# Patient Record
Sex: Female | Born: 1975 | Race: White | Hispanic: No | Marital: Single | Smoking: Never smoker
Health system: Southern US, Community
[De-identification: ages and names within clinical notes are randomized; demographics above are authoritative.]

## PROBLEM LIST (undated history)

## (undated) DIAGNOSIS — F329 Major depressive disorder, single episode, unspecified: Secondary | ICD-10-CM

## (undated) DIAGNOSIS — I340 Nonrheumatic mitral (valve) insufficiency: Secondary | ICD-10-CM

## (undated) DIAGNOSIS — Z8619 Personal history of other infectious and parasitic diseases: Secondary | ICD-10-CM

## (undated) DIAGNOSIS — B001 Herpesviral vesicular dermatitis: Secondary | ICD-10-CM

## (undated) DIAGNOSIS — I4892 Unspecified atrial flutter: Secondary | ICD-10-CM

## (undated) DIAGNOSIS — S32009A Unspecified fracture of unspecified lumbar vertebra, initial encounter for closed fracture: Secondary | ICD-10-CM

## (undated) DIAGNOSIS — Z9289 Personal history of other medical treatment: Secondary | ICD-10-CM

## (undated) DIAGNOSIS — Z87442 Personal history of urinary calculi: Secondary | ICD-10-CM

## (undated) DIAGNOSIS — Z8041 Family history of malignant neoplasm of ovary: Secondary | ICD-10-CM

## (undated) DIAGNOSIS — F419 Anxiety disorder, unspecified: Secondary | ICD-10-CM

## (undated) DIAGNOSIS — Z87898 Personal history of other specified conditions: Secondary | ICD-10-CM

## (undated) DIAGNOSIS — Z803 Family history of malignant neoplasm of breast: Secondary | ICD-10-CM

## (undated) DIAGNOSIS — K219 Gastro-esophageal reflux disease without esophagitis: Secondary | ICD-10-CM

## (undated) DIAGNOSIS — R011 Cardiac murmur, unspecified: Secondary | ICD-10-CM

## (undated) DIAGNOSIS — F32A Depression, unspecified: Secondary | ICD-10-CM

## (undated) DIAGNOSIS — Z1371 Encounter for nonprocreative screening for genetic disease carrier status: Secondary | ICD-10-CM

## (undated) DIAGNOSIS — R102 Pelvic and perineal pain: Secondary | ICD-10-CM

## (undated) DIAGNOSIS — U071 COVID-19: Secondary | ICD-10-CM

## (undated) DIAGNOSIS — E669 Obesity, unspecified: Secondary | ICD-10-CM

## (undated) DIAGNOSIS — I499 Cardiac arrhythmia, unspecified: Secondary | ICD-10-CM

## (undated) HISTORY — DX: Herpesviral vesicular dermatitis: B00.1

## (undated) HISTORY — DX: Personal history of other medical treatment: Z92.89

## (undated) HISTORY — PX: ELBOW SURGERY: SHX618

## (undated) HISTORY — DX: Pelvic and perineal pain: R10.2

## (undated) HISTORY — DX: Family history of malignant neoplasm of breast: Z80.3

## (undated) HISTORY — PX: ABLATION: SHX5711

## (undated) HISTORY — DX: Personal history of other specified conditions: Z87.898

## (undated) HISTORY — PX: ENDOMETRIAL ABLATION: SHX621

## (undated) HISTORY — PX: INDUCED ABORTION: SHX677

## (undated) HISTORY — DX: Family history of malignant neoplasm of ovary: Z80.41

## (undated) HISTORY — DX: COVID-19: U07.1

---

## 1996-07-30 DIAGNOSIS — Z8751 Personal history of pre-term labor: Secondary | ICD-10-CM

## 1998-06-11 HISTORY — PX: DILATION AND CURETTAGE OF UTERUS: SHX78

## 2003-06-12 HISTORY — PX: DIAGNOSTIC LAPAROSCOPY: SUR761

## 2004-07-07 ENCOUNTER — Ambulatory Visit: Payer: Self-pay | Admitting: Obstetrics and Gynecology

## 2004-08-15 ENCOUNTER — Ambulatory Visit: Payer: Self-pay | Admitting: Obstetrics and Gynecology

## 2004-08-17 ENCOUNTER — Ambulatory Visit: Payer: Self-pay

## 2006-03-05 ENCOUNTER — Emergency Department: Payer: Self-pay | Admitting: Emergency Medicine

## 2006-06-11 HISTORY — PX: BACK SURGERY: SHX140

## 2006-11-13 ENCOUNTER — Ambulatory Visit: Payer: Self-pay

## 2006-12-25 ENCOUNTER — Ambulatory Visit: Payer: Self-pay | Admitting: Family Medicine

## 2007-01-04 ENCOUNTER — Emergency Department: Payer: Self-pay

## 2007-04-11 ENCOUNTER — Inpatient Hospital Stay (HOSPITAL_COMMUNITY): Admission: RE | Admit: 2007-04-11 | Discharge: 2007-04-14 | Payer: Self-pay | Admitting: Neurosurgery

## 2008-04-30 ENCOUNTER — Ambulatory Visit: Payer: Self-pay | Admitting: Family Medicine

## 2009-05-03 ENCOUNTER — Ambulatory Visit: Payer: Self-pay | Admitting: Family Medicine

## 2009-12-01 ENCOUNTER — Emergency Department: Payer: Self-pay | Admitting: Emergency Medicine

## 2010-01-27 ENCOUNTER — Ambulatory Visit: Payer: Self-pay | Admitting: Family Medicine

## 2010-08-16 ENCOUNTER — Ambulatory Visit: Payer: Self-pay | Admitting: Family Medicine

## 2010-10-24 NOTE — Op Note (Signed)
Cynthia Solis, Cynthia Solis                ACCOUNT NO.:  [id number]   MEDICAL RECORD NO.:  [id number]          PATIENT TYPE:  INP   LOCATION:  3172                         FACILITY:  [facility]   PHYSICIAN:  Payton Doughty, M.D.      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   OPERATIVE PROCEDURE:  L5-S1 Gill procedure, posterior lumbar antibody  fusion, nonsegmental pedicle screw fixation, and posterolateral  arthrodesis.   ANESTHESIA:  General endotracheal anesthesia.   PREPARATION:  Betadine prep and alcohol wipe.   COMPLICATIONS:  None.   SURGEON:  Payton Doughty, M.D.   ASSISTANTBasilia Jumbo.   BODY OF TEXT:  35 year old girl with bilateral pars defects of L5, taken  to the operating room, smoothly anesthetized and intubated, placed prone  on the operating table. Following shave, prep, drape in the usual  sterile fashion, the skin was infiltrated with 1% some lidocaine with  1:100,000 epinephrine.  Then, the skin was incised from mid L4 to mid S1  and the lamina and transverse process of L4 and the sacral ala were  exposed bilaterally in a subperiosteal plane.  Intraoperative x-ray  confirmed correctness of the level.  Having confirmed correctness of the  level, the lamina of L5 was drilled down using a high speed drill and as  a single piece with the inferior facet of L5 and the pars remnant was  removed en toto.  This allowed exposure of the L5 root as it rounded the  L5 pedicle.  It was completely decompressed.  Most of the S1 superior  facet was left.  This was done bilaterally and the compression was  comparable on each side.  A PEEK cage 11 mm was then selected and placed  in the midline at L5-S1.  This was done with minimal distraction of  either L5 root, which were fairly tense.  This was done with the thecal  sac retracted from lateral to medial.  Once the cage had been packed  with bone graft in place, pedicle screws were then placed in L5 and S1  using the standard landmarks.  Intraoperative x-ray showed good  placement of the screws.  They were connected by the rod and the caps  tightened with the final tightener.  The transverse process of L5 and  the sacral ala were decorticated with a high speed drill and packed with  BMP on the extender matrix.  Intraoperative x-ray showed good placement of cage, screws, and rods.  Successive layers of 0 Vicryl, 2-0 Vicryl, and 3-0 nylon were used to  close.  Betadine and Telfa dressing was applied.  The patient returned  to the recovery room in good condition.           ______________________________  Payton Doughty, M.D.  MWR/MEDQ  D:  04/11/2007  T:  04/11/2007  Job:  [id number]

## 2010-10-24 NOTE — H&P (Signed)
NAMESHIR, BERGMAN                ACCOUNT NO.:  [id number]   MEDICAL RECORD NO.:  [id number]          PATIENT TYPE:  INP   LOCATION:  3001                         FACILITY:  [facility]   PHYSICIAN:  Payton Doughty, M.D.      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     The patient is a 35 year old right-handed white female who since 2001  had increasing pain in her back, down her legs.  It was found in 2000  she has had spondylolysis and slight progression of her  spondylolisthesis.  She has managed this conservatively for some time  and has increasing pain in her back and down her legs, and she is now  admitted for decompression and fusion.   MEDICAL HISTORY:  Otherwise benign.   She is on oral contraceptives and Wellbutrin.   She is allergic to AVELOX.   SURGICAL HISTORY:  A D&C in 2002 and a laparoscopy in 2006.   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   FAMILY HISTORY:  Mom has had breast cancer twice, in good health now.  Father is in good health except for his back.   REVIEW OF SYSTEMS:  Remarkable for leg weakness and leg pain.   HEENT:  Within normal limits.  She has reasonable range of motion of her neck.  CHEST:  Clear.  CARDIAC:  Regular rate and rhythm.  ABDOMEN:  Nontender with no hepatosplenomegaly.  Extremities: No clubbing or cyanosis.  GU:  exam is deferred.  Peripheral pulses are good.  NEUROLOGIC:  She is awake, alert and oriented.  Cranial nerves appear to  function normally.  Motor exam shows 5/5 strength throughout the upper  and lower extremities.  No current sensory deficit.  Reflexes are 2 at  the knees, 2 at the ankles, and toes downgoing bilaterally.  When her  back flares up she seeks comfort by bending forward.   Films show  spondylolysis at L5 with a grade 1 slip of L5 on S1.  This is  confirmed on MR.  With flexion and extension she moves about 2 mm.   CLINICAL IMPRESSION:  Lumbar radicular pain and increasing back pain.   PLAN:  For a Gill procedure with fusion at L5-S1.  The risks and  benefits have been discussed with her and she wished to proceed.           ______________________________  Payton Doughty, M.D.     MWR/MEDQ  D:  04/11/2007  T:  04/11/2007  Job:  [id number]

## 2010-10-27 NOTE — Discharge Summary (Signed)
Cynthia Solis, Cynthia Solis                ACCOUNT NO.:  [id number]   MEDICAL RECORD NO.:  [id number]          PATIENT TYPE:  INP   LOCATION:  3001                         FACILITY:  [facility]   PHYSICIAN:  Clydene Fake, M.D.  DATE OF BIRTH:  June 28, 1975   DATE OF ADMISSION:  04/11/2007  DATE OF DISCHARGE:  04/14/2007                               DISCHARGE SUMMARY   ADMITTING DIAGNOSES:  1. Spondylosis L5.  2. Spondylolisthesis L5-S1.   DISCHARGE DIAGNOSES:  1. Spondylosis L5.  2. Spondylolisthesis L5-S1.   PROCEDURE:  L5-S1 posterior lumbar interbody Gill decompression,  posterior lumbar interbody fusion with instrumentation by Dr. Channing Mutters.   REASON FOR ADMISSION:  The patient is 35 year old woman with increasing  back pain and leg managed conservatively.  She continued to have  increasing pain and was admitted for decompression and fusion.   HOSPITAL COURSE:  The patient was admitted the day of surgery and  underwent procedure above without complications.  Postop, the patient  was transferred to recovery room and then to the floor.  She was started  on Neurontin postoperatively for some neuropathic pain.  On the  following day, she had incisional pain and some left buttock pain, but  was overall doing well.  The incision was clean, dry and intact.  She  started increasing activity.  PT/OT was consulted and aware of  increasing activity.  She continued making good progress in her  activity.  On 04/14/2007, she was discharged home in stable condition.  She was ambulating much better, but still having some hip and leg pain.  She was given Neurontin 300 t.i.d. and discharged with Percocet and  Flexeril on p.r.n.  The rest of her home medicines are the same.   FOLLOWUP:  Dr. Channing Mutters in three weeks.  No strenuous activity and up with  brace.           ______________________________  Clydene Fake, M.D.     JRH/MEDQ  D:  06/17/2007  T:  06/17/2007  Job:  [id number]

## 2011-03-06 ENCOUNTER — Ambulatory Visit: Payer: Self-pay

## 2011-03-21 LAB — TYPE AND SCREEN: ABO/RH(D): O POS

## 2011-03-21 LAB — URINALYSIS, ROUTINE W REFLEX MICROSCOPIC
Nitrite: NEGATIVE
Urobilinogen, UA: 0.2
pH: 6.5

## 2011-03-21 LAB — DIFFERENTIAL
Basophils Absolute: 0.1
Basophils Relative: 1
Eosinophils Absolute: 0.1
Eosinophils Relative: 2
Lymphocytes Relative: 33
Monocytes Absolute: 0.5

## 2011-03-21 LAB — URINE MICROSCOPIC-ADD ON

## 2011-03-21 LAB — COMPREHENSIVE METABOLIC PANEL
ALT: 15
AST: 17
Albumin: 3.7
BUN: 12
GFR calc Af Amer: >60
Sodium: 137
Total Protein: 6.6

## 2011-03-21 LAB — PROTIME-INR
INR: 0.9
Prothrombin Time: 12.3

## 2011-03-21 LAB — CBC
HCT: 42.4
Hemoglobin: 14.3
MCV: 89.8
RBC: 4.72

## 2011-03-21 LAB — APTT: aPTT: 26

## 2012-09-19 ENCOUNTER — Ambulatory Visit: Payer: Self-pay

## 2013-10-13 ENCOUNTER — Emergency Department (HOSPITAL_COMMUNITY)
Admission: EM | Admit: 2013-10-13 | Discharge: 2013-10-13 | Disposition: A | Discharge status: Home / Self Care | Payer: Self-pay | Attending: Emergency Medicine | Admitting: Emergency Medicine

## 2013-10-13 ENCOUNTER — Emergency Department (HOSPITAL_COMMUNITY): Payer: Self-pay

## 2013-10-13 ENCOUNTER — Encounter (HOSPITAL_COMMUNITY): Payer: Self-pay | Admitting: Emergency Medicine

## 2013-10-13 DIAGNOSIS — R0602 Shortness of breath: Secondary | ICD-10-CM | POA: Insufficient documentation

## 2013-10-13 DIAGNOSIS — R079 Chest pain, unspecified: Secondary | ICD-10-CM | POA: Insufficient documentation

## 2013-10-13 LAB — BASIC METABOLIC PANEL
BUN: 15 mg/dL (ref 6–23)
CO2: 26 mEq/L (ref 19–32)
Calcium: 9.2 mg/dL (ref 8.4–10.5)
Chloride: 105 mEq/L (ref 96–112)
Creatinine, Ser: 0.96 mg/dL (ref 0.50–1.10)
GFR calc Af Amer: 87 mL/min — ABNORMAL LOW (ref >90)
GFR calc non Af Amer: 75 mL/min — ABNORMAL LOW (ref >90)
Glucose, Bld: 102 mg/dL — ABNORMAL HIGH (ref 70–99)
POTASSIUM: 4.1 meq/L (ref 3.7–5.3)
SODIUM: 142 meq/L (ref 137–147)

## 2013-10-13 LAB — CBC
HEMATOCRIT: 41.8 % (ref 36.0–46.0)
Hemoglobin: 14 g/dL (ref 12.0–15.0)
MCH: 30.6 pg (ref 26.0–34.0)
MCHC: 33.5 g/dL (ref 30.0–36.0)
MCV: 91.3 fL (ref 78.0–100.0)
PLATELETS: 275 10*3/uL (ref 150–400)
RBC: 4.58 MIL/uL (ref 3.87–5.11)
RDW: 13.4 % (ref 11.5–15.5)
WBC: 6.6 10*3/uL (ref 4.0–10.5)

## 2013-10-13 LAB — I-STAT TROPONIN, ED: Troponin i, poc: 0.01 ng/mL (ref 0.00–0.08)

## 2013-10-13 NOTE — ED Notes (Signed)
Pt reports central chest pain since this am, worse with deep breath. Reports shortness of breath with exertion.

## 2013-10-13 NOTE — ED Notes (Signed)
Called for triage x1 with no answer. 

## 2014-04-20 DIAGNOSIS — Z9289 Personal history of other medical treatment: Secondary | ICD-10-CM

## 2014-04-20 HISTORY — DX: Personal history of other medical treatment: Z92.89

## 2014-06-27 DIAGNOSIS — Z87898 Personal history of other specified conditions: Secondary | ICD-10-CM

## 2014-06-27 HISTORY — DX: Personal history of other specified conditions: Z87.898

## 2014-07-18 ENCOUNTER — Emergency Department: Payer: Self-pay | Admitting: Emergency Medicine

## 2014-07-18 LAB — COMPREHENSIVE METABOLIC PANEL
ALBUMIN: 4.2 g/dL (ref 3.4–5.0)
ALK PHOS: 75 U/L (ref 46–116)
ALT: 39 U/L (ref 14–63)
Anion Gap: 7 (ref 7–16)
BILIRUBIN TOTAL: 0.6 mg/dL (ref 0.2–1.0)
BUN: 10 mg/dL (ref 7–18)
CO2: 26 mmol/L (ref 21–32)
CREATININE: 0.86 mg/dL (ref 0.60–1.30)
Calcium, Total: 8.9 mg/dL (ref 8.5–10.1)
Chloride: 105 mmol/L (ref 98–107)
EGFR (African American): >60
GLUCOSE: 80 mg/dL (ref 65–99)
OSMOLALITY: 274 (ref 275–301)
Potassium: 3.6 mmol/L (ref 3.5–5.1)
SGOT(AST): 28 U/L (ref 15–37)
SODIUM: 138 mmol/L (ref 136–145)
Total Protein: 7.8 g/dL (ref 6.4–8.2)

## 2014-07-18 LAB — TROPONIN I
Troponin-I: <0.02 ng/mL
Troponin-I: <0.02 ng/mL

## 2014-07-18 LAB — CBC
HCT: 45 % (ref 35.0–47.0)
HGB: 15.2 g/dL (ref 12.0–16.0)
MCH: 30.1 pg (ref 26.0–34.0)
MCHC: 33.7 g/dL (ref 32.0–36.0)
MCV: 89 fL (ref 80–100)
PLATELETS: 330 10*3/uL (ref 150–440)
RBC: 5.04 10*6/uL (ref 3.80–5.20)
RDW: 13.7 % (ref 11.5–14.5)
WBC: 9.7 10*3/uL (ref 3.6–11.0)

## 2014-07-18 LAB — LIPASE, BLOOD: Lipase: 104 U/L (ref 73–393)

## 2014-07-18 LAB — D-DIMER(ARMC): D-DIMER: 419 ng/mL

## 2014-08-10 DIAGNOSIS — Z803 Family history of malignant neoplasm of breast: Secondary | ICD-10-CM

## 2014-08-10 HISTORY — DX: Family history of malignant neoplasm of breast: Z80.3

## 2015-01-04 ENCOUNTER — Ambulatory Visit
Admission: RE | Admit: 2015-01-04 | Discharge: 2015-01-04 | Disposition: A | Discharge status: Home / Self Care | Payer: PRIVATE HEALTH INSURANCE | Source: Ambulatory Visit | Attending: Family Medicine | Admitting: Family Medicine

## 2015-01-04 ENCOUNTER — Other Ambulatory Visit: Payer: Self-pay | Admitting: Family Medicine

## 2015-01-04 DIAGNOSIS — M79661 Pain in right lower leg: Secondary | ICD-10-CM | POA: Insufficient documentation

## 2015-01-04 DIAGNOSIS — M7989 Other specified soft tissue disorders: Principal | ICD-10-CM

## 2015-01-04 DIAGNOSIS — M79604 Pain in right leg: Secondary | ICD-10-CM

## 2015-02-15 ENCOUNTER — Other Ambulatory Visit: Payer: Self-pay | Admitting: Physical Medicine and Rehabilitation

## 2015-02-15 DIAGNOSIS — M5416 Radiculopathy, lumbar region: Secondary | ICD-10-CM

## 2015-02-22 ENCOUNTER — Ambulatory Visit
Admission: RE | Admit: 2015-02-22 | Discharge: 2015-02-22 | Disposition: A | Discharge status: Home / Self Care | Payer: PRIVATE HEALTH INSURANCE | Source: Ambulatory Visit | Attending: Physical Medicine and Rehabilitation | Admitting: Physical Medicine and Rehabilitation

## 2015-02-22 DIAGNOSIS — M79604 Pain in right leg: Secondary | ICD-10-CM | POA: Diagnosis present

## 2015-02-22 DIAGNOSIS — M5416 Radiculopathy, lumbar region: Secondary | ICD-10-CM

## 2015-02-22 DIAGNOSIS — M4326 Fusion of spine, lumbar region: Secondary | ICD-10-CM | POA: Diagnosis not present

## 2015-02-22 DIAGNOSIS — M545 Low back pain: Secondary | ICD-10-CM | POA: Diagnosis present

## 2015-03-08 ENCOUNTER — Other Ambulatory Visit: Payer: Self-pay | Admitting: Obstetrics and Gynecology

## 2015-03-08 DIAGNOSIS — R110 Nausea: Secondary | ICD-10-CM

## 2015-03-10 ENCOUNTER — Ambulatory Visit
Admission: RE | Admit: 2015-03-10 | Discharge: 2015-03-10 | Disposition: A | Discharge status: Home / Self Care | Payer: PRIVATE HEALTH INSURANCE | Source: Ambulatory Visit | Attending: Obstetrics and Gynecology | Admitting: Obstetrics and Gynecology

## 2015-03-10 DIAGNOSIS — R110 Nausea: Secondary | ICD-10-CM

## 2015-03-10 DIAGNOSIS — K760 Fatty (change of) liver, not elsewhere classified: Secondary | ICD-10-CM | POA: Insufficient documentation

## 2015-03-28 ENCOUNTER — Other Ambulatory Visit: Payer: Self-pay | Admitting: Physician Assistant

## 2015-03-28 ENCOUNTER — Encounter
Admission: RE | Admit: 2015-03-28 | Discharge: 2015-03-28 | Disposition: A | Discharge status: Home / Self Care | Payer: PRIVATE HEALTH INSURANCE | Source: Ambulatory Visit | Attending: Obstetrics & Gynecology | Admitting: Obstetrics & Gynecology

## 2015-03-28 DIAGNOSIS — Z8041 Family history of malignant neoplasm of ovary: Secondary | ICD-10-CM

## 2015-03-28 DIAGNOSIS — R1909 Other intra-abdominal and pelvic swelling, mass and lump: Secondary | ICD-10-CM | POA: Insufficient documentation

## 2015-03-28 DIAGNOSIS — Z01818 Encounter for other preprocedural examination: Secondary | ICD-10-CM | POA: Diagnosis not present

## 2015-03-28 DIAGNOSIS — R438 Other disturbances of smell and taste: Secondary | ICD-10-CM

## 2015-03-28 HISTORY — DX: Cardiac murmur, unspecified: R01.1

## 2015-03-28 HISTORY — DX: Depression, unspecified: F32.A

## 2015-03-28 HISTORY — DX: Anxiety disorder, unspecified: F41.9

## 2015-03-28 HISTORY — DX: Major depressive disorder, single episode, unspecified: F32.9

## 2015-03-28 HISTORY — DX: Family history of malignant neoplasm of ovary: Z80.41

## 2015-03-28 LAB — CBC
HCT: 41.1 % (ref 35.0–47.0)
Hemoglobin: 13.9 g/dL (ref 12.0–16.0)
MCH: 30.6 pg (ref 26.0–34.0)
MCHC: 33.8 g/dL (ref 32.0–36.0)
MCV: 90.5 fL (ref 80.0–100.0)
PLATELETS: 343 10*3/uL (ref 150–440)
RBC: 4.54 MIL/uL (ref 3.80–5.20)
RDW: 14 % (ref 11.5–14.5)
WBC: 7.9 10*3/uL (ref 3.6–11.0)

## 2015-03-28 LAB — ABO/RH: ABO/RH(D): O POS

## 2015-03-28 LAB — TYPE AND SCREEN
ABO/RH(D): O POS
Antibody Screen: NEGATIVE

## 2015-03-28 NOTE — Patient Instructions (Signed)
  Your procedure is scheduled on: Tuesday Oct. 25, 2016 Report to Same Day Surgery. To find out your arrival time please call [phone] between 1PM - 3PM on Monday Oct.24, 2016  Remember: Instructions that are not followed completely may result in serious medical risk, up to and including death, or upon the discretion of your surgeon and anesthesiologist your surgery may need to be rescheduled.    __x_ 1. Do not eat food or drink liquids after midnight. No gum chewing or hard candies.     ____ 2. No Alcohol for 24 hours before or after surgery.   ____ 3. Bring all medications with you on the day of surgery if instructed.    _x__ 4. Notify your doctor if there is any change in your medical condition     (cold, fever, infections).     Do not wear jewelry, make-up, hairpins, clips or nail polish.  Do not wear lotions, powders, or perfumes. You may wear deodorant.  Do not shave 48 hours prior to surgery. Men may shave face and neck.  Do not bring valuables to the hospital.    [facility] is not responsible for any belongings or valuables.               Contacts, dentures or bridgework may not be worn into surgery.  Leave your suitcase in the car. After surgery it may be brought to your room.  For patients admitted to the hospital, discharge time is determined by your treatment team.   Patients discharged the day of surgery will not be allowed to drive home.    Please read over the following fact sheets that you were given:   [facility] Preparing for [facility]  __x_ Take these medicines the morning of surgery with A SIP OF WATER:    1. metoprolol tartrate (LOPRESSOR)  2. gabapentin (NEURONTIN)  3. buPROPion (WELLBUTRIN XL)   4. PRAZolam Prudy Feeler(XANAX) optional    ____ Fleet Enema (as directed)   __x_ Use CHG Soap as directed  ____ Use inhalers on the day of surgery  ____ Stop metformin 2 days prior to surgery    ____ Take 1/2 of usual insulin dose the night before surgery and  none on the morning of urgery.   __x_ Stop Coumadin/Plavix/aspirin on does not apply  _x__ Stop Anti-inflammatories now, Tylenol ok to take for pain.  ____ Stop supplements until after surgery.    ____ Bring C-Pap to the hospital.

## 2015-03-29 LAB — CA 125: CA 125: 8.4 U/mL (ref 0.0–38.1)

## 2015-04-05 ENCOUNTER — Ambulatory Visit: Payer: PRIVATE HEALTH INSURANCE | Admitting: Anesthesiology

## 2015-04-05 ENCOUNTER — Encounter: Payer: Self-pay | Admitting: *Deleted

## 2015-04-05 ENCOUNTER — Encounter
Admission: RE | Disposition: A | Discharge status: Home / Self Care | Payer: Self-pay | Source: Ambulatory Visit | Attending: Obstetrics & Gynecology

## 2015-04-05 ENCOUNTER — Ambulatory Visit
Admission: RE | Admit: 2015-04-05 | Discharge: 2015-04-05 | Disposition: A | Discharge status: Home / Self Care | Payer: PRIVATE HEALTH INSURANCE | Source: Ambulatory Visit | Attending: Obstetrics & Gynecology | Admitting: Obstetrics & Gynecology

## 2015-04-05 DIAGNOSIS — N8302 Follicular cyst of left ovary: Secondary | ICD-10-CM | POA: Diagnosis not present

## 2015-04-05 DIAGNOSIS — Z79899 Other long term (current) drug therapy: Secondary | ICD-10-CM | POA: Insufficient documentation

## 2015-04-05 DIAGNOSIS — N83209 Unspecified ovarian cyst, unspecified side: Secondary | ICD-10-CM | POA: Diagnosis present

## 2015-04-05 DIAGNOSIS — N838 Other noninflammatory disorders of ovary, fallopian tube and broad ligament: Secondary | ICD-10-CM | POA: Diagnosis not present

## 2015-04-05 DIAGNOSIS — Z888 Allergy status to other drugs, medicaments and biological substances status: Secondary | ICD-10-CM | POA: Insufficient documentation

## 2015-04-05 DIAGNOSIS — R011 Cardiac murmur, unspecified: Secondary | ICD-10-CM | POA: Diagnosis not present

## 2015-04-05 DIAGNOSIS — Z803 Family history of malignant neoplasm of breast: Secondary | ICD-10-CM | POA: Diagnosis not present

## 2015-04-05 DIAGNOSIS — Z8042 Family history of malignant neoplasm of prostate: Secondary | ICD-10-CM | POA: Insufficient documentation

## 2015-04-05 DIAGNOSIS — Z820 Family history of epilepsy and other diseases of the nervous system: Secondary | ICD-10-CM | POA: Insufficient documentation

## 2015-04-05 DIAGNOSIS — Z8249 Family history of ischemic heart disease and other diseases of the circulatory system: Secondary | ICD-10-CM | POA: Insufficient documentation

## 2015-04-05 DIAGNOSIS — Z8041 Family history of malignant neoplasm of ovary: Secondary | ICD-10-CM | POA: Insufficient documentation

## 2015-04-05 DIAGNOSIS — Z833 Family history of diabetes mellitus: Secondary | ICD-10-CM | POA: Insufficient documentation

## 2015-04-05 DIAGNOSIS — Z823 Family history of stroke: Secondary | ICD-10-CM | POA: Diagnosis not present

## 2015-04-05 DIAGNOSIS — F418 Other specified anxiety disorders: Secondary | ICD-10-CM | POA: Diagnosis not present

## 2015-04-05 DIAGNOSIS — R102 Pelvic and perineal pain: Secondary | ICD-10-CM | POA: Insufficient documentation

## 2015-04-05 DIAGNOSIS — N8312 Corpus luteum cyst of left ovary: Secondary | ICD-10-CM | POA: Diagnosis not present

## 2015-04-05 DIAGNOSIS — Z882 Allergy status to sulfonamides status: Secondary | ICD-10-CM | POA: Diagnosis not present

## 2015-04-05 HISTORY — PX: LAPAROSCOPIC SALPINGO OOPHERECTOMY: SHX5927

## 2015-04-05 HISTORY — PX: LAPAROSCOPY: SHX197

## 2015-04-05 SURGERY — LAPAROSCOPY OPERATIVE
Anesthesia: General

## 2015-04-05 MED ORDER — DEXAMETHASONE SODIUM PHOSPHATE 4 MG/ML IJ SOLN
INTRAMUSCULAR | Status: DC | PRN
  Administered 2015-04-05: 8 mg via INTRAVENOUS

## 2015-04-05 MED ORDER — FENTANYL CITRATE (PF) 100 MCG/2ML IJ SOLN
25.0000 ug | INTRAMUSCULAR | Status: DC | PRN
  Administered 2015-04-05 (×4): 25 ug via INTRAVENOUS

## 2015-04-05 MED ORDER — FAMOTIDINE 20 MG PO TABS
20.0000 mg | ORAL_TABLET | Freq: Once | ORAL | Status: AC
  Administered 2015-04-05: 20 mg via ORAL

## 2015-04-05 MED ORDER — ACETAMINOPHEN 10 MG/ML IV SOLN
INTRAVENOUS | Status: AC
  Filled 2015-04-05: qty 100

## 2015-04-05 MED ORDER — NEOSTIGMINE METHYLSULFATE 10 MG/10ML IV SOLN
INTRAVENOUS | Status: DC | PRN
  Administered 2015-04-05: 3 mg via INTRAVENOUS

## 2015-04-05 MED ORDER — ONDANSETRON HCL 4 MG/2ML IJ SOLN
4.0000 mg | Freq: Once | INTRAMUSCULAR | Status: AC | PRN
  Administered 2015-04-05: 4 mg via INTRAVENOUS

## 2015-04-05 MED ORDER — EPHEDRINE SULFATE 50 MG/ML IJ SOLN
INTRAMUSCULAR | Status: DC | PRN
  Administered 2015-04-05: 10 mg via INTRAVENOUS
  Administered 2015-04-05 (×2): 5 mg via INTRAVENOUS

## 2015-04-05 MED ORDER — LACTATED RINGERS IV SOLN
INTRAVENOUS | Status: DC
  Administered 2015-04-05 (×2): via INTRAVENOUS

## 2015-04-05 MED ORDER — MIDAZOLAM HCL 2 MG/2ML IJ SOLN
INTRAMUSCULAR | Status: DC | PRN
  Administered 2015-04-05: 2 mg via INTRAVENOUS

## 2015-04-05 MED ORDER — FENTANYL CITRATE (PF) 100 MCG/2ML IJ SOLN
INTRAMUSCULAR | Status: AC
  Administered 2015-04-05: 25 ug via INTRAVENOUS
  Filled 2015-04-05: qty 2

## 2015-04-05 MED ORDER — FENTANYL CITRATE (PF) 100 MCG/2ML IJ SOLN
INTRAMUSCULAR | Status: DC | PRN
  Administered 2015-04-05: 100 ug via INTRAVENOUS

## 2015-04-05 MED ORDER — ONDANSETRON HCL 4 MG/2ML IJ SOLN
INTRAMUSCULAR | Status: DC | PRN
  Administered 2015-04-05: 4 mg via INTRAVENOUS

## 2015-04-05 MED ORDER — PROPOFOL 10 MG/ML IV BOLUS
INTRAVENOUS | Status: DC | PRN
  Administered 2015-04-05: 200 mg via INTRAVENOUS

## 2015-04-05 MED ORDER — GLYCOPYRROLATE 0.2 MG/ML IJ SOLN
INTRAMUSCULAR | Status: DC | PRN
  Administered 2015-04-05: 0.6 mg via INTRAVENOUS

## 2015-04-05 MED ORDER — OXYCODONE-ACETAMINOPHEN 5-325 MG PO TABS
1.0000 | ORAL_TABLET | ORAL | Status: DC | PRN
  Administered 2015-04-05: 1 via ORAL

## 2015-04-05 MED ORDER — SUCCINYLCHOLINE CHLORIDE 20 MG/ML IJ SOLN
INTRAMUSCULAR | Status: DC | PRN
  Administered 2015-04-05: 100 mg via INTRAVENOUS

## 2015-04-05 MED ORDER — OXYCODONE-ACETAMINOPHEN 5-325 MG PO TABS: 1.0000 | ORAL_TABLET | ORAL | Status: DC | PRN

## 2015-04-05 MED ORDER — LIDOCAINE HCL (CARDIAC) 20 MG/ML IV SOLN
INTRAVENOUS | Status: DC | PRN
  Administered 2015-04-05: 100 mg via INTRAVENOUS

## 2015-04-05 MED ORDER — BUPIVACAINE HCL (PF) 0.5 % IJ SOLN
INTRAMUSCULAR | Status: DC | PRN
  Administered 2015-04-05: 14 mL

## 2015-04-05 MED ORDER — ACETAMINOPHEN 10 MG/ML IV SOLN
INTRAVENOUS | Status: DC | PRN
  Administered 2015-04-05: 1000 mg via INTRAVENOUS

## 2015-04-05 MED ORDER — OXYCODONE-ACETAMINOPHEN 5-325 MG PO TABS
ORAL_TABLET | ORAL | Status: AC
  Filled 2015-04-05: qty 1

## 2015-04-05 MED ORDER — FAMOTIDINE 20 MG PO TABS
ORAL_TABLET | ORAL | Status: AC
  Filled 2015-04-05: qty 1

## 2015-04-05 MED ORDER — KETOROLAC TROMETHAMINE 30 MG/ML IJ SOLN
INTRAMUSCULAR | Status: DC | PRN
  Administered 2015-04-05: 30 mg via INTRAVENOUS

## 2015-04-05 MED ORDER — BUPIVACAINE HCL (PF) 0.5 % IJ SOLN
INTRAMUSCULAR | Status: AC
  Filled 2015-04-05: qty 30

## 2015-04-05 MED ORDER — ONDANSETRON HCL 4 MG/2ML IJ SOLN
INTRAMUSCULAR | Status: AC
  Administered 2015-04-05: 4 mg via INTRAVENOUS
  Filled 2015-04-05: qty 2

## 2015-04-05 MED ORDER — FENTANYL CITRATE (PF) 100 MCG/2ML IJ SOLN
INTRAMUSCULAR | Status: AC
  Filled 2015-04-05: qty 2

## 2015-04-05 MED ORDER — ROCURONIUM BROMIDE 100 MG/10ML IV SOLN
INTRAVENOUS | Status: DC | PRN
  Administered 2015-04-05: 5 mg via INTRAVENOUS
  Administered 2015-04-05: 10 mg via INTRAVENOUS

## 2015-04-05 SURGICAL SUPPLY — 33 items
BLADE SURG SZ11 CARB STEEL (BLADE) ×3 IMPLANT
CANISTER SUCT 1200ML W/VALVE (MISCELLANEOUS) ×3 IMPLANT
CATH ROBINSON RED A/P 16FR (CATHETERS) ×3 IMPLANT
CHLORAPREP W/TINT 26ML (MISCELLANEOUS) ×3 IMPLANT
DRESSING TELFA 4X3 1S ST N-ADH (GAUZE/BANDAGES/DRESSINGS) IMPLANT
ENDOPOUCH RETRIEVER 10 (MISCELLANEOUS) IMPLANT
GAUZE SPONGE NON-WVN 2X2 STRL (MISCELLANEOUS) IMPLANT
GLOVE BIO SURGEON STRL SZ8 (GLOVE) ×12 IMPLANT
GLOVE INDICATOR 7.5 STRL GRN (GLOVE) ×12 IMPLANT
GOWN STRL REUS W/ TWL LRG LVL3 (GOWN DISPOSABLE) ×2 IMPLANT
GOWN STRL REUS W/ TWL XL LVL3 (GOWN DISPOSABLE) ×2 IMPLANT
GOWN STRL REUS W/TWL LRG LVL3 (GOWN DISPOSABLE) ×1
GOWN STRL REUS W/TWL XL LVL3 (GOWN DISPOSABLE) ×1
IRRIGATION STRYKERFLOW (MISCELLANEOUS) IMPLANT
IRRIGATOR STRYKERFLOW (MISCELLANEOUS)
IV LACTATED RINGERS 1000ML (IV SOLUTION) IMPLANT
LABEL OR SOLS (LABEL) ×3 IMPLANT
LIQUID BAND (GAUZE/BANDAGES/DRESSINGS) ×3 IMPLANT
NEEDLE VERESS 14GA 120MM (NEEDLE) ×3 IMPLANT
NS IRRIG 500ML POUR BTL (IV SOLUTION) ×3 IMPLANT
PACK GYN LAPAROSCOPIC (MISCELLANEOUS) ×3 IMPLANT
PAD PREP 24X41 OB/GYN DISP (PERSONAL CARE ITEMS) ×3 IMPLANT
SCISSORS METZENBAUM CVD 33 (INSTRUMENTS) IMPLANT
SHEARS HARMONIC ACE PLUS 36CM (ENDOMECHANICALS) IMPLANT
SLEEVE ENDOPATH XCEL 5M (ENDOMECHANICALS) ×3 IMPLANT
SPONGE VERSALON 2X2 STRL (MISCELLANEOUS)
STRAP SAFETY BODY (MISCELLANEOUS) ×3 IMPLANT
SUT VIC AB 2-0 UR6 27 (SUTURE) ×3 IMPLANT
SUT VIC AB 4-0 PS2 18 (SUTURE) IMPLANT
SYRINGE 10CC LL (SYRINGE) ×3 IMPLANT
TROCAR ENDO BLADELESS 11MM (ENDOMECHANICALS) IMPLANT
TROCAR XCEL NON-BLD 5MMX100MML (ENDOMECHANICALS) ×3 IMPLANT
TUBING INSUFFLATOR HI FLOW (MISCELLANEOUS) ×3 IMPLANT

## 2015-04-05 NOTE — Transfer of Care (Signed)
Immediate Anesthesia Transfer of Care Note  Patient: Cynthia Solis  Procedure(s) Performed: Procedure(s): LAPAROSCOPY OPERATIVE (N/A) LAPAROSCOPIC SALPINGO OOPHORECTOMY (Left)  Patient Location: PACU  Anesthesia Type:General  Level of Consciousness: awake and sedated  Airway & Oxygen Therapy: Patient Spontanous Breathing and Patient connected to nasal cannula oxygen  Post-op Assessment: Report given to RN and Post -op Vital signs reviewed and stable  Post vital signs: Reviewed and stable  Last Vitals:  Filed Vitals:   04/05/15 1151  BP: 92/53  Pulse: 71  Temp: 35.9 C  Resp: 18    Complications: No apparent anesthesia complications

## 2015-04-05 NOTE — Op Note (Signed)
  Operative Note   04/05/2015  PRE-OP DIAGNOSIS: Left Ovarian Cyst, Pelvic Pain, Family History of Ovarian Cancer   POST-OP DIAGNOSIS: same   PROCEDURE: Procedure(s): LAPAROSCOPY OPERATIVE LAPAROSCOPIC SALPINGO OOPHORECTOMY   SURGEON: Annamarie MajorPaul Anaeli Cornwall, MD, FACOG  ANESTHESIA: Gen   ESTIMATED BLOOD LOSS: Min  COMPLICATIONS: None  DISPOSITION: PACU - hemodynamically stable.  CONDITION: stable  FINDINGS: Laparoscopic survey of the abdomen revealed a grossly normal uterus, tubes, R ovary, liver edge, gallbladder edge and appendix, No intra-abdominal adhesions were noted. Left Ovarian Cyst noted, multiloculated and tubal cyst as well.  PROCEDURE IN DETAIL: The patient was taken to the OR where anesthesia was administed. The patient was positioned in dorsal lithotomy in the ChalfantAllen stirrups. The patient was then examined under anesthesia with the above noted findings. The patient was prepped and draped in the normal sterile fashion and foley catheter was placed. A Graves speculum was placed in the vagina and the anterior lip of the cervix was grasped with a single toothed tenaculum.  Uterine mobility was found to be satisfactory. The speculum was then removed.  Attention was turned to the patient's abdomen where a 5 mm skin incision was made in the umbilical fold, after injection of local anesthesia. The Veress step needle was carefully introduced into the peritoneal cavity with placement confirmed using the hanging drop technique.  Pneumoperitoneum was obtained. The 5 mm port was then placed under direct visualization with the operative laparoscope.  Trendelenburg positioning.  Additional 11mm trocar was then placed in the RLQ lateral to the inferior epigastric blood vessels under direct visualization with the laparoscope.  Instrumentation to visualize complete pelvic anatomy performed.  A 5mm trocar was also then placed in the suprapubic region.  The ovarian cyst is identified and stabilized.  The  infundibulopelvic ligament and its blood vessels were carefully coagulated and dissected, after visualization of the ureter was done, to completely excise the left tube and ovary. Hemostasis is visualized and assured.  Contralateral ovary seen as normal.  Pelvic cavity is cleaned with any fluid aspirated.  Instruments and trocars removed, gas expelled, and skin closed with skin adhesive glue.  Instrument, needle, and sponge counts correct x2 at the conclusion of the case.  Pt goes to recovery room in stable condition.  Frozen Section analysis revealed benign ovary.

## 2015-04-05 NOTE — Anesthesia Preprocedure Evaluation (Signed)
Anesthesia Evaluation  Patient identified by MRN, date of birth, ID band Patient awake    Reviewed: Allergy & Precautions, NPO status , Patient's Chart, lab work & pertinent test results, reviewed documented beta blocker date and time   Airway Mallampati: III  TM Distance: >3 FB Neck ROM: Full    Dental  (+) Chipped   Pulmonary neg pulmonary ROS,    Pulmonary exam normal breath sounds clear to auscultation       Cardiovascular Normal cardiovascular exam  Heart murmur   Neuro/Psych Anxiety Depression    GI/Hepatic negative GI ROS, Neg liver ROS,   Endo/Other  negative endocrine ROS  Renal/GU negative Renal ROS  negative genitourinary   Musculoskeletal negative musculoskeletal ROS (+)   Abdominal Normal abdominal exam  (+)   Peds negative pediatric ROS (+)  Hematology negative hematology ROS (+)   Anesthesia Other Findings   Reproductive/Obstetrics                             Anesthesia Physical Anesthesia Plan  ASA: II  Anesthesia Plan: General   Post-op Pain Management:    Induction: Intravenous  Airway Management Planned: Oral ETT  Additional Equipment:   Intra-op Plan:   Post-operative Plan: Extubation in OR  Informed Consent: I have reviewed the patients History and Physical, chart, labs and discussed the procedure including the risks, benefits and alternatives for the proposed anesthesia with the patient or authorized representative who has indicated his/her understanding and acceptance.   Dental advisory given  Plan Discussed with: CRNA and Surgeon  Anesthesia Plan Comments:         Anesthesia Quick Evaluation

## 2015-04-05 NOTE — Anesthesia Postprocedure Evaluation (Signed)
  Anesthesia Post-op Note  Patient: Cynthia Solis  Procedure(s) Performed: Procedure(s): LAPAROSCOPY OPERATIVE (N/A) LAPAROSCOPIC SALPINGO OOPHORECTOMY (Left)  Anesthesia type:General  Patient location: PACU  Post pain: Pain level controlled  Post assessment: Post-op Vital signs reviewed, Patient's Cardiovascular Status Stable, Respiratory Function Stable, Patent Airway and No signs of Nausea or vomiting  Post vital signs: Reviewed and stable  Last Vitals:  Filed Vitals:   04/05/15 1231  BP:   Pulse: 88  Temp:   Resp: 20    Level of consciousness: awake, alert  and patient cooperative  Complications: No apparent anesthesia complications

## 2015-04-05 NOTE — Anesthesia Procedure Notes (Signed)
Procedure Name: Intubation Performed by: Ginger CarneMICHELET, Sheryle Vice Pre-anesthesia Checklist: Patient identified, Emergency Drugs available, Suction available and Patient being monitored Patient Re-evaluated:Patient Re-evaluated prior to inductionOxygen Delivery Method: Circle system utilized Preoxygenation: Pre-oxygenation with 100% oxygen Intubation Type: IV induction and Cricoid Pressure applied Ventilation: Mask ventilation without difficulty Laryngoscope Size: Miller and 2 Grade View: Grade II Tube type: Oral Tube size: 7.0 mm Number of attempts: 1 Airway Equipment and Method: Stylet Placement Confirmation: ETT inserted through vocal cords under direct vision Secured at: 21 cm Tube secured with: Tape Dental Injury: Teeth and Oropharynx as per pre-operative assessment

## 2015-04-05 NOTE — H&P (Signed)
History and Physical Interval Note:  04/05/2015 9:33 AM  Cynthia Solis  has presented today for surgery, with the diagnosis of LEFT ADNEXAL MASS  The various methods of treatment have been discussed with the patient and family. After consideration of risks, benefits and other options for treatment, the patient has consented to  Procedure(s): LAPAROSCOPY OPERATIVE (N/A) and LEFT OVARIAN CYSTECTOMY vs OOPHORECTOMY vs NECESSARY PROCEDURES FOR OVARIAN CANCER if found, as a surgical intervention .  The patient's history has been reviewed, patient examined, no change in status, stable for surgery.  Pt has the following beta blocker history-  Not taking Beta Blocker.  I have reviewed the patient's chart and labs.  Questions were answered to the patient's satisfaction.     HARRIS,ROBERT Renae FicklePAUL

## 2015-04-05 NOTE — Discharge Instructions (Signed)
Diagnostic Laparoscopy/ Left Salpingo-oophorectomy A diagnostic laparoscopy is a procedure to diagnose diseases in the abdomen. During the procedure, a thin, lighted, pencil-sized instrument called a laparoscope is inserted into the abdomen through an incision. The laparoscope allows your health care provider to look at the organs inside your body. LET [facility] CARE PROVIDER KNOW ABOUT:  Any allergies you have.  All medicines you are taking, including vitamins, herbs, eye drops, creams, and over-the-counter medicines.  Previous problems you or members of your family have had with the use of anesthetics.  Any blood disorders you have.  Previous surgeries you have had.  Medical conditions you have. RISKS AND COMPLICATIONS  Generally, this is a safe procedure. However, problems can occur, which may include:  Infection.  Bleeding.  Damage to other organs.  Allergic reaction to the anesthetics used during the procedure. BEFORE THE PROCEDURE  Do not eat or drink anything after midnight on the night before the procedure or as directed by your health care provider.  Ask your health care provider about:  Changing or stopping your regular medicines.  Taking medicines such as aspirin and ibuprofen. These medicines can thin your blood. Do not take these medicines before your procedure if your health care provider instructs you not to.  Plan to have someone take you home after the procedure. PROCEDURE  You may be given a medicine to help you relax (sedative).  You will be given a medicine to make you sleep (general anesthetic).  Your abdomen will be inflated with a gas. This will make your organs easier to see.  Small incisions will be made in your abdomen.  A laparoscope and other small instruments will be inserted into the abdomen through the incisions.  A tissue sample may be removed from an organ in the abdomen for examination.  The instruments will be removed from the  abdomen.  The gas will be released.  The incisions will be closed with stitches (sutures). AFTER THE PROCEDURE  Your blood pressure, heart rate, breathing rate, and blood oxygen level will be monitored often until the medicines you were given have worn off.   This information is not intended to replace advice given to you by your health care provider. Make sure you discuss any questions you have with your health care provider.   Document Released: 09/03/2000 Document Revised: 02/16/2015 Document Reviewed: 01/08/2014 Elsevier Interactive Patient Education 2016 [employer] AMBULATORY SURGERY  DISCHARGE INSTRUCTIONS   1) The drugs that you were given will stay in your system until tomorrow so for the next 24 hours you should not:  A) Drive an automobile B) Make any legal decisions C) Drink any alcoholic beverage   2) You may resume regular meals tomorrow.  Today it is better to start with liquids and gradually work up to solid foods.  You may eat anything you prefer, but it is better to start with liquids, then soup and crackers, and gradually work up to solid foods.   3) Please notify your doctor immediately if you have any unusual bleeding, trouble breathing, redness and pain at the surgery site, drainage, fever, or pain not relieved by medication.    4) Additional Instructions:        Please contact your physician with any problems or Same Day Surgery at [phone], Monday through Friday 6 am to 4 pm, or [place] [facility] number at [phone].AMBULATORY SURGERY          DISCHARGE INSTRUCTIONS   5) The drugs  that you were given will stay in your system until tomorrow so for the next 24 hours you should not:  D) Drive an automobile E) Make any legal decisions F) Drink any alcoholic beverage   6) You may resume regular meals tomorrow.  Today it is better to start with liquids and gradually work up to solid foods.  You may eat anything you  prefer, but it is better to start with liquids, then soup and crackers, and gradually work up to solid foods.   7) Please notify your doctor immediately if you have any unusual bleeding, trouble breathing, redness and pain at the surgery site, drainage, fever, or pain not relieved by medication. 8)   9) Your post-operative visit with Dr.                                     is: Date:                        Time:    Please call to schedule your post-operative visit.  10) Additional Instructions:

## 2015-04-06 LAB — SURGICAL PATHOLOGY

## 2015-04-18 ENCOUNTER — Ambulatory Visit
Admission: RE | Admit: 2015-04-18 | Discharge: 2015-04-18 | Disposition: A | Discharge status: Home / Self Care | Payer: PRIVATE HEALTH INSURANCE | Source: Ambulatory Visit | Attending: Physician Assistant | Admitting: Physician Assistant

## 2015-04-18 DIAGNOSIS — R438 Other disturbances of smell and taste: Secondary | ICD-10-CM

## 2015-04-18 MED ORDER — GADOBENATE DIMEGLUMINE 529 MG/ML IV SOLN: 20.0000 mL | Freq: Once | INTRAVENOUS | Status: DC | PRN

## 2015-06-17 ENCOUNTER — Other Ambulatory Visit: Payer: Self-pay | Admitting: Nurse Practitioner

## 2015-06-17 DIAGNOSIS — K625 Hemorrhage of anus and rectum: Secondary | ICD-10-CM

## 2015-06-17 DIAGNOSIS — R1084 Generalized abdominal pain: Secondary | ICD-10-CM

## 2015-06-28 ENCOUNTER — Ambulatory Visit
Admission: RE | Admit: 2015-06-28 | Discharge: 2015-06-28 | Disposition: A | Discharge status: Home / Self Care | Payer: Managed Care, Other (non HMO) | Source: Ambulatory Visit | Attending: Nurse Practitioner | Admitting: Nurse Practitioner

## 2015-06-28 DIAGNOSIS — Z981 Arthrodesis status: Secondary | ICD-10-CM | POA: Diagnosis not present

## 2015-06-28 DIAGNOSIS — R1084 Generalized abdominal pain: Secondary | ICD-10-CM | POA: Diagnosis present

## 2015-06-28 DIAGNOSIS — Z975 Presence of (intrauterine) contraceptive device: Secondary | ICD-10-CM | POA: Insufficient documentation

## 2015-06-28 DIAGNOSIS — K625 Hemorrhage of anus and rectum: Secondary | ICD-10-CM

## 2015-06-28 MED ORDER — IOHEXOL 300 MG/ML  SOLN
100.0000 mL | Freq: Once | INTRAMUSCULAR | Status: AC | PRN
  Administered 2015-06-28: 100 mL via INTRAVENOUS

## 2015-07-13 ENCOUNTER — Encounter: Payer: Self-pay | Admitting: *Deleted

## 2015-07-14 ENCOUNTER — Encounter
Admission: RE | Disposition: A | Discharge status: Home / Self Care | Payer: Self-pay | Source: Ambulatory Visit | Attending: Gastroenterology

## 2015-07-14 ENCOUNTER — Ambulatory Visit
Admission: RE | Admit: 2015-07-14 | Discharge: 2015-07-14 | Disposition: A | Discharge status: Home / Self Care | Payer: Managed Care, Other (non HMO) | Source: Ambulatory Visit | Attending: Gastroenterology | Admitting: Gastroenterology

## 2015-07-14 ENCOUNTER — Ambulatory Visit: Payer: Managed Care, Other (non HMO) | Admitting: Anesthesiology

## 2015-07-14 ENCOUNTER — Encounter: Payer: Self-pay | Admitting: Anesthesiology

## 2015-07-14 DIAGNOSIS — R195 Other fecal abnormalities: Secondary | ICD-10-CM | POA: Insufficient documentation

## 2015-07-14 DIAGNOSIS — R110 Nausea: Secondary | ICD-10-CM | POA: Insufficient documentation

## 2015-07-14 DIAGNOSIS — Z79899 Other long term (current) drug therapy: Secondary | ICD-10-CM | POA: Insufficient documentation

## 2015-07-14 DIAGNOSIS — K625 Hemorrhage of anus and rectum: Secondary | ICD-10-CM | POA: Diagnosis present

## 2015-07-14 DIAGNOSIS — Z882 Allergy status to sulfonamides status: Secondary | ICD-10-CM | POA: Insufficient documentation

## 2015-07-14 DIAGNOSIS — F329 Major depressive disorder, single episode, unspecified: Secondary | ICD-10-CM | POA: Insufficient documentation

## 2015-07-14 DIAGNOSIS — K31819 Angiodysplasia of stomach and duodenum without bleeding: Secondary | ICD-10-CM | POA: Insufficient documentation

## 2015-07-14 DIAGNOSIS — R1084 Generalized abdominal pain: Secondary | ICD-10-CM | POA: Insufficient documentation

## 2015-07-14 DIAGNOSIS — Z888 Allergy status to other drugs, medicaments and biological substances status: Secondary | ICD-10-CM | POA: Diagnosis not present

## 2015-07-14 DIAGNOSIS — R1013 Epigastric pain: Secondary | ICD-10-CM | POA: Insufficient documentation

## 2015-07-14 DIAGNOSIS — K219 Gastro-esophageal reflux disease without esophagitis: Secondary | ICD-10-CM | POA: Diagnosis not present

## 2015-07-14 DIAGNOSIS — F419 Anxiety disorder, unspecified: Secondary | ICD-10-CM | POA: Diagnosis not present

## 2015-07-14 DIAGNOSIS — Z6839 Body mass index (BMI) 39.0-39.9, adult: Secondary | ICD-10-CM | POA: Insufficient documentation

## 2015-07-14 DIAGNOSIS — K649 Unspecified hemorrhoids: Secondary | ICD-10-CM | POA: Insufficient documentation

## 2015-07-14 HISTORY — PX: COLONOSCOPY WITH PROPOFOL: SHX5780

## 2015-07-14 HISTORY — PX: ESOPHAGOGASTRODUODENOSCOPY: SHX5428

## 2015-07-14 HISTORY — DX: Unspecified fracture of unspecified lumbar vertebra, initial encounter for closed fracture: S32.009A

## 2015-07-14 HISTORY — DX: Obesity, unspecified: E66.9

## 2015-07-14 HISTORY — DX: Personal history of other infectious and parasitic diseases: Z86.19

## 2015-07-14 LAB — POCT PREGNANCY, URINE: PREG TEST UR: NEGATIVE

## 2015-07-14 SURGERY — COLONOSCOPY WITH PROPOFOL
Anesthesia: General

## 2015-07-14 MED ORDER — LIDOCAINE HCL (CARDIAC) 20 MG/ML IV SOLN
INTRAVENOUS | Status: DC | PRN
  Administered 2015-07-14: 100 mg via INTRAVENOUS

## 2015-07-14 MED ORDER — GLYCOPYRROLATE 0.2 MG/ML IJ SOLN
INTRAMUSCULAR | Status: DC | PRN
  Administered 2015-07-14: 0.2 mg via INTRAVENOUS

## 2015-07-14 MED ORDER — PROPOFOL 10 MG/ML IV BOLUS
INTRAVENOUS | Status: DC | PRN
  Administered 2015-07-14: 120 mg via INTRAVENOUS

## 2015-07-14 MED ORDER — SODIUM CHLORIDE 0.9 % IV SOLN: INTRAVENOUS | Status: DC

## 2015-07-14 MED ORDER — PROPOFOL 500 MG/50ML IV EMUL
INTRAVENOUS | Status: DC | PRN
  Administered 2015-07-14: 150 ug/kg/min via INTRAVENOUS

## 2015-07-14 MED ORDER — SODIUM CHLORIDE 0.9 % IV SOLN
INTRAVENOUS | Status: DC
  Administered 2015-07-14: 1000 mL via INTRAVENOUS

## 2015-07-14 NOTE — Op Note (Signed)
[facility] Gastroenterology Patient Name: Cynthia Solis Procedure Date: 07/14/2015 10:44 AM MRN: [id number] Account #: [id number] Date of Birth: 10/14/1975 Admit Type: Outpatient Age: 40 Room: [facility] ENDO ROOM 4 Gender: Female Note Status: Finalized Procedure:         Colonoscopy Indications:       Rectal bleeding Providers:         Ezzard Standing. Bluford Kaufmann, MD Referring MD:      Teena Irani. Terance Hart, MD (Referring MD) Medicines:         Monitored Anesthesia Care Complications:     No immediate complications. Procedure:         Pre-Anesthesia Assessment:                    - Prior to the procedure, a History and Physical was                     performed, and patient medications, allergies and                     sensitivities were reviewed. The patient's tolerance of                     previous anesthesia was reviewed.                    - The risks and benefits of the procedure and the sedation                     options and risks were discussed with the patient. All                     questions were answered and informed consent was obtained.                    - After reviewing the risks and benefits, the patient was                     deemed in satisfactory condition to undergo the procedure.                    After obtaining informed consent, the colonoscope was                     passed under direct vision. Throughout the procedure, the                     patient's blood pressure, pulse, and oxygen saturations                     were monitored continuously. The Colonoscope was                     introduced through the anus and advanced to the the cecum,                     identified by appendiceal orifice and ileocecal valve. The                     colonoscopy was performed without difficulty. The patient                     tolerated the procedure well. The quality of the bowel  preparation was good. Findings:      The colon (entire examined  portion) appeared normal. Entrance to TI       normal though not able to get scope into TI.      The perianal exam findings include hemorrhoids. Impression:        - The entire examined colon is normal.                    - Hemorrhoids found on perianal exam.                    - No specimens collected. Recommendation:    - Bleeding likely from hemorrhoids. Procedure Code(s): --- Professional ---                    [id number], Colonoscopy, flexible; diagnostic, including                     collection of specimen(s) by brushing or washing, when                     performed (separate procedure) Diagnosis Code(s): --- Professional ---                    K64.9, Unspecified hemorrhoids                    K62.5, Hemorrhage of anus and rectum CPT copyright 2014 American Medical Association. All rights reserved. The codes documented in this report are preliminary and upon coder review may  be revised to meet current compliance requirements. Wallace Cullens, MD 07/14/2015 11:05:24 AM This report has been signed electronically. Number of Addenda: 0 Note Initiated On: 07/14/2015 10:44 AM Scope Withdrawal Time: 0 hours 4 minutes 9 seconds  Total Procedure Duration: 0 hours 5 minutes 33 seconds       [facility]

## 2015-07-14 NOTE — H&P (Signed)
  Date of Initial H&P: 06/17/2015  History reviewed, patient examined, no change in status, stable for surgery. 

## 2015-07-14 NOTE — Op Note (Signed)
[facility] Gastroenterology Patient Name: Cynthia Solis Procedure Date: 07/14/2015 10:44 AM MRN: [id number] Account #: [id number] Date of Birth: 02-04-1976 Admit Type: Outpatient Age: 40 Room: [facility] ENDO ROOM 4 Gender: Female Note Status: Finalized Procedure:         Upper GI endoscopy Indications:       Generalized abdominal pain, Dyspepsia, Nausea Providers:         Ezzard Standing. Bluford Kaufmann, MD Referring MD:      Teena Irani. Terance Hart, MD (Referring MD) Medicines:         Monitored Anesthesia Care Complications:     No immediate complications. Procedure:         Pre-Anesthesia Assessment:                    - Prior to the procedure, a History and Physical was                     performed, and patient medications, allergies and                     sensitivities were reviewed. The patient's tolerance of                     previous anesthesia was reviewed.                    - The risks and benefits of the procedure and the sedation                     options and risks were discussed with the patient. All                     questions were answered and informed consent was obtained.                    - After reviewing the risks and benefits, the patient was                     deemed in satisfactory condition to undergo the procedure.                    After obtaining informed consent, the endoscope was passed                     under direct vision. Throughout the procedure, the                     patient's blood pressure, pulse, and oxygen saturations                     were monitored continuously. The Endoscope was introduced                     through the mouth, and advanced to the second part of                     duodenum. The upper GI endoscopy was accomplished without                     difficulty. The patient tolerated the procedure well. Findings:      The examined esophagus was normal.      A single small no bleeding angiodysplastic lesion was found in the     gastric body.  The exam was otherwise without abnormality.      The examined duodenum was normal.      Gastric biopsies taken. Impression:        - Normal esophagus.                    - A single non-bleeding angiodysplastic lesion in the                     stomach.                    - The examination was otherwise normal.                    - Normal examined duodenum.                    - No specimens collected. Recommendation:    - Discharge patient to home.                    - Observe patient's clinical course.                    - Await pathology results.                    - The findings and recommendations were discussed with the                     patient. Procedure Code(s): --- Professional ---                    [phone], Esophagogastroduodenoscopy, flexible, transoral;                     diagnostic, including collection of specimen(s) by                     brushing or washing, when performed (separate procedure) Diagnosis Code(s): --- Professional ---                    U04.540, Angiodysplasia of stomach and duodenum without                     bleeding                    R10.84, Generalized abdominal pain                    K30, Functional dyspepsia                    R11.0, Nausea CPT copyright 2014 American Medical Association. All rights reserved. The codes documented in this report are preliminary and upon coder review may  be revised to meet current compliance requirements. Wallace Cullens, MD 07/14/2015 10:56:16 AM This report has been signed electronically. Number of Addenda: 0 Note Initiated On: 07/14/2015 10:44 AM      [facility]

## 2015-07-14 NOTE — Anesthesia Postprocedure Evaluation (Signed)
Anesthesia Post Note  Patient: Cynthia Solis  Procedure(s) Performed: Procedure(s) (LRB): COLONOSCOPY WITH PROPOFOL (N/A) ESOPHAGOGASTRODUODENOSCOPY (EGD) (N/A)  Patient location during evaluation: Endoscopy Anesthesia Type: General Level of consciousness: awake and alert Pain management: pain level controlled Vital Signs Assessment: post-procedure vital signs reviewed and stable Respiratory status: spontaneous breathing, nonlabored ventilation, respiratory function stable and patient connected to nasal cannula oxygen Cardiovascular status: blood pressure returned to baseline and stable Postop Assessment: no signs of nausea or vomiting Anesthetic complications: no    Last Vitals:  Filed Vitals:   07/14/15 1130 07/14/15 1140  BP: 91/64 98/68  Pulse: 80 77  Temp:    Resp: 14 13    Last Pain: There were no vitals filed for this visit.               Lenard Simmer

## 2015-07-14 NOTE — Anesthesia Preprocedure Evaluation (Signed)
Anesthesia Evaluation  Patient identified by MRN, date of birth, ID band Patient awake    Reviewed: Allergy & Precautions, H&P , NPO status , Patient's Chart, lab work & pertinent test results, reviewed documented beta blocker date and time   History of Anesthesia Complications Negative for: history of anesthetic complications  Airway Mallampati: II  TM Distance: >3 FB Neck ROM: full    Dental no notable dental hx. (+) Caps   Pulmonary neg pulmonary ROS,    Pulmonary exam normal breath sounds clear to auscultation       Cardiovascular Exercise Tolerance: Good (-) hypertension(-) angina(-) CAD, (-) Past MI, (-) Cardiac Stents and (-) CABG Normal cardiovascular exam(-) dysrhythmias + Valvular Problems/Murmurs MR  Rhythm:regular Rate:Normal     Neuro/Psych PSYCHIATRIC DISORDERS (Depression and anxiety) negative neurological ROS     GI/Hepatic Neg liver ROS, GERD  Medicated,  Endo/Other  neg diabetesMorbid obesity  Renal/GU negative Renal ROS  negative genitourinary   Musculoskeletal   Abdominal   Peds  Hematology negative hematology ROS (+)   Anesthesia Other Findings Past Medical History:   Heart murmur                                                 Anxiety                                                      Depression                                                   Fracture of lumbar spine (HCC)                               History of chicken pox                                       Obesity                                                      Reproductive/Obstetrics negative OB ROS                             Anesthesia Physical Anesthesia Plan  ASA: III  Anesthesia Plan: General   Post-op Pain Management:    Induction:   Airway Management Planned:   Additional Equipment:   Intra-op Plan:   Post-operative Plan:   Informed Consent: I have reviewed the patients  History and Physical, chart, labs and discussed the procedure including the risks, benefits and alternatives for the proposed anesthesia with the patient or authorized representative who has indicated his/her understanding and acceptance.   Dental Advisory Given  Plan Discussed with: Anesthesiologist, CRNA and Surgeon  Anesthesia Plan Comments:         Anesthesia Quick Evaluation

## 2015-07-14 NOTE — Transfer of Care (Signed)
Immediate Anesthesia Transfer of Care Note  Patient: Cynthia Solis  Procedure(s) Performed: Procedure(s): COLONOSCOPY WITH PROPOFOL (N/A) ESOPHAGOGASTRODUODENOSCOPY (EGD) (N/A)  Patient Location: Endoscopy Unit  Anesthesia Type:General  Level of Consciousness: sedated  Airway & Oxygen Therapy: Patient Spontanous Breathing and Patient connected to nasal cannula oxygen  Post-op Assessment: Report given to RN and Post -op Vital signs reviewed and stable  Post vital signs: Reviewed and stable  Last Vitals:  Filed Vitals:   07/14/15 1007  BP: 121/83  Pulse: 76  Temp: 36.8 C  Resp: 16    Complications: No apparent anesthesia complications

## 2015-07-15 ENCOUNTER — Encounter: Payer: Self-pay | Admitting: Gastroenterology

## 2015-07-15 LAB — SURGICAL PATHOLOGY

## 2015-08-01 ENCOUNTER — Emergency Department: Payer: Managed Care, Other (non HMO)

## 2015-08-01 ENCOUNTER — Encounter: Payer: Self-pay | Admitting: Radiology

## 2015-08-01 ENCOUNTER — Emergency Department
Admission: EM | Admit: 2015-08-01 | Discharge: 2015-08-01 | Disposition: A | Discharge status: Home / Self Care | Payer: Managed Care, Other (non HMO) | Attending: Emergency Medicine | Admitting: Emergency Medicine

## 2015-08-01 DIAGNOSIS — Z79899 Other long term (current) drug therapy: Secondary | ICD-10-CM | POA: Diagnosis not present

## 2015-08-01 DIAGNOSIS — N760 Acute vaginitis: Secondary | ICD-10-CM | POA: Diagnosis not present

## 2015-08-01 DIAGNOSIS — Z3202 Encounter for pregnancy test, result negative: Secondary | ICD-10-CM | POA: Insufficient documentation

## 2015-08-01 DIAGNOSIS — R1031 Right lower quadrant pain: Secondary | ICD-10-CM

## 2015-08-01 DIAGNOSIS — N83201 Unspecified ovarian cyst, right side: Secondary | ICD-10-CM | POA: Diagnosis not present

## 2015-08-01 DIAGNOSIS — B9689 Other specified bacterial agents as the cause of diseases classified elsewhere: Secondary | ICD-10-CM

## 2015-08-01 DIAGNOSIS — R109 Unspecified abdominal pain: Secondary | ICD-10-CM

## 2015-08-01 LAB — CBC
HEMATOCRIT: 40.1 % (ref 35.0–47.0)
HEMOGLOBIN: 13.9 g/dL (ref 12.0–16.0)
MCH: 30.1 pg (ref 26.0–34.0)
MCHC: 34.6 g/dL (ref 32.0–36.0)
MCV: 87.2 fL (ref 80.0–100.0)
Platelets: 308 10*3/uL (ref 150–440)
RBC: 4.6 MIL/uL (ref 3.80–5.20)
RDW: 13.4 % (ref 11.5–14.5)
WBC: 9.5 10*3/uL (ref 3.6–11.0)

## 2015-08-01 LAB — WET PREP, GENITAL
Sperm: NONE SEEN
Trich, Wet Prep: NONE SEEN
Yeast Wet Prep HPF POC: NONE SEEN

## 2015-08-01 LAB — COMPREHENSIVE METABOLIC PANEL
ALK PHOS: 62 U/L (ref 38–126)
ALT: 43 U/L (ref 14–54)
ANION GAP: 4 — AB (ref 5–15)
AST: 23 U/L (ref 15–41)
Albumin: 4.6 g/dL (ref 3.5–5.0)
BUN: 18 mg/dL (ref 6–20)
CALCIUM: 9 mg/dL (ref 8.9–10.3)
CO2: 26 mmol/L (ref 22–32)
Chloride: 107 mmol/L (ref 101–111)
Creatinine, Ser: 0.9 mg/dL (ref 0.44–1.00)
GFR calc non Af Amer: >60 mL/min (ref >60)
Glucose, Bld: 135 mg/dL — ABNORMAL HIGH (ref 65–99)
POTASSIUM: 3.7 mmol/L (ref 3.5–5.1)
SODIUM: 137 mmol/L (ref 135–145)
TOTAL PROTEIN: 7.5 g/dL (ref 6.5–8.1)
Total Bilirubin: 0.7 mg/dL (ref 0.3–1.2)

## 2015-08-01 LAB — URINALYSIS COMPLETE WITH MICROSCOPIC (ARMC ONLY)
Bacteria, UA: NONE SEEN
Bilirubin Urine: NEGATIVE
Glucose, UA: NEGATIVE mg/dL
Hgb urine dipstick: NEGATIVE
KETONES UR: NEGATIVE mg/dL
Leukocytes, UA: NEGATIVE
NITRITE: NEGATIVE
PH: 5 (ref 5.0–8.0)
PROTEIN: NEGATIVE mg/dL
SPECIFIC GRAVITY, URINE: 1.025 (ref 1.005–1.030)

## 2015-08-01 LAB — LIPASE, BLOOD: LIPASE: 23 U/L (ref 11–51)

## 2015-08-01 LAB — CHLAMYDIA/NGC RT PCR (ARMC ONLY)
CHLAMYDIA TR: NOT DETECTED
N GONORRHOEAE: NOT DETECTED

## 2015-08-01 LAB — POCT PREGNANCY, URINE: Preg Test, Ur: NEGATIVE

## 2015-08-01 MED ORDER — IOHEXOL 240 MG/ML SOLN
25.0000 mL | Freq: Once | INTRAMUSCULAR | Status: AC | PRN
  Administered 2015-08-01: 25 mL via ORAL

## 2015-08-01 MED ORDER — IBUPROFEN 800 MG PO TABS: 800.0000 mg | ORAL_TABLET | Freq: Three times a day (TID) | ORAL | Status: DC | PRN

## 2015-08-01 MED ORDER — SODIUM CHLORIDE 0.9 % IV BOLUS (SEPSIS)
1000.0000 mL | Freq: Once | INTRAVENOUS | Status: AC
  Administered 2015-08-01: 1000 mL via INTRAVENOUS

## 2015-08-01 MED ORDER — IOHEXOL 300 MG/ML  SOLN
100.0000 mL | Freq: Once | INTRAMUSCULAR | Status: AC | PRN
  Administered 2015-08-01: 100 mL via INTRAVENOUS

## 2015-08-01 MED ORDER — MORPHINE SULFATE (PF) 4 MG/ML IV SOLN
4.0000 mg | Freq: Once | INTRAVENOUS | Status: AC
  Administered 2015-08-01: 4 mg via INTRAVENOUS
  Filled 2015-08-01: qty 1

## 2015-08-01 MED ORDER — ONDANSETRON HCL 4 MG/2ML IJ SOLN
4.0000 mg | Freq: Once | INTRAMUSCULAR | Status: AC
  Administered 2015-08-01: 4 mg via INTRAVENOUS
  Filled 2015-08-01: qty 2

## 2015-08-01 MED ORDER — METRONIDAZOLE 500 MG PO TABS
500.0000 mg | ORAL_TABLET | Freq: Once | ORAL | Status: AC
  Administered 2015-08-01: 500 mg via ORAL
  Filled 2015-08-01: qty 1

## 2015-08-01 MED ORDER — ONDANSETRON HCL 4 MG/2ML IJ SOLN
4.0000 mg | Freq: Once | INTRAMUSCULAR | Status: AC
  Administered 2015-08-01: 4 mg via INTRAVENOUS

## 2015-08-01 MED ORDER — METRONIDAZOLE 500 MG PO TABS: 500.0000 mg | ORAL_TABLET | Freq: Two times a day (BID) | ORAL | Status: DC

## 2015-08-01 NOTE — ED Provider Notes (Signed)
[facility]  I accepted care from Dr. Zenda Alpers  ____________________________________________    RADIOLOGY All xrays were viewed by me. Imaging interpreted by radiologist.  CT abdomen and pelvis with contrast:   IMPRESSION: No acute intra-abdominal or pelvic pathology.  A punctate nonobstructing left renal interpolar calculus. No hydronephrosis.  ____________________________________________   PROCEDURES  Procedure(s) performed: None  Critical Care performed: None  ____________________________________________   INITIAL IMPRESSION / ASSESSMENT AND PLAN / ED COURSE   Pertinent labs & imaging results that were available during my care of the patient were reviewed by me and considered in my medical decision making (see chart for details).  I reviewed patient's CT scan showing no acute intra-abdominal emergency. I discussed with the patient pain is likely from right ovarian cyst. She's been discharged with Flagyl for bacterial vaginosis.  CONSULTATIONS: None    Patient / Family / Caregiver informed of clinical course, medical decision-making process, and agree with plan.   I discussed return precautions, follow-up instructions, and discharged instructions with patient and/or family.    ____________________________________________   FINAL CLINICAL IMPRESSION(S) / ED DIAGNOSES  Final diagnoses:  Bacterial vaginosis  Right lower quadrant abdominal pain  Right ovarian cyst        Governor Rooks, MD 08/01/15 1010

## 2015-08-01 NOTE — Discharge Instructions (Signed)
You were evaluated for lower abdominal and right lower quadrant pain and your exam and evaluation are reassuring. You were found have a right-sided ovarian cyst. You are also being treated for bacterial vaginosis with Flagyl antibiotic. Return to emergency room for any worsening pain, nausea/vomiting, fever, or any other symptoms concerning to you.  Ovarian Cyst An ovarian cyst is a fluid-filled sac that forms on an ovary. The ovaries are small organs that produce eggs in women. Various types of cysts can form on the ovaries. Most are not cancerous. Many do not cause problems, and they often go away on their own. Some may cause symptoms and require treatment. Common types of ovarian cysts include:  Functional cysts--These cysts may occur every month during the menstrual cycle. This is normal. The cysts usually go away with the next menstrual cycle if the woman does not get pregnant. Usually, there are no symptoms with a functional cyst.  Endometrioma cysts--These cysts form from the tissue that lines the uterus. They are also called "chocolate cysts" because they become filled with blood that turns brown. This type of cyst can cause pain in the lower abdomen during intercourse and with your menstrual period.  Cystadenoma cysts--This type develops from the cells on the outside of the ovary. These cysts can get very big and cause lower abdomen pain and pain with intercourse. This type of cyst can twist on itself, cut off its blood supply, and cause severe pain. It can also easily rupture and cause a lot of pain.  Dermoid cysts--This type of cyst is sometimes found in both ovaries. These cysts may contain different kinds of body tissue, such as skin, teeth, hair, or cartilage. They usually do not cause symptoms unless they get very big.  Theca lutein cysts--These cysts occur when too much of a certain hormone (human chorionic gonadotropin) is produced and overstimulates the ovaries to produce an egg. This  is most common after procedures used to assist with the conception of a baby (in vitro fertilization). CAUSES   Fertility drugs can cause a condition in which multiple large cysts are formed on the ovaries. This is called ovarian hyperstimulation syndrome.  A condition called polycystic ovary syndrome can cause hormonal imbalances that can lead to nonfunctional ovarian cysts. SIGNS AND SYMPTOMS  Many ovarian cysts do not cause symptoms. If symptoms are present, they may include:  Pelvic pain or pressure.  Pain in the lower abdomen.  Pain during sexual intercourse.  Increasing girth (swelling) of the abdomen.  Abnormal menstrual periods.  Increasing pain with menstrual periods.  Stopping having menstrual periods without being pregnant. DIAGNOSIS  These cysts are commonly found during a routine or annual pelvic exam. Tests may be ordered to find out more about the cyst. These tests may include:  Ultrasound.  X-ray of the pelvis.  CT scan.  MRI.  Blood tests. TREATMENT  Many ovarian cysts go away on their own without treatment. Your health care provider may want to check your cyst regularly for 2-3 months to see if it changes. For women in menopause, it is particularly important to monitor a cyst closely because of the higher rate of ovarian cancer in menopausal women. When treatment is needed, it may include any of the following:  A procedure to drain the cyst (aspiration). This may be done using a long needle and ultrasound. It can also be done through a laparoscopic procedure. This involves using a thin, lighted tube with a tiny camera on the end (laparoscope) inserted  through a small incision.  Surgery to remove the whole cyst. This may be done using laparoscopic surgery or an open surgery involving a larger incision in the lower abdomen.  Hormone treatment or birth control pills. These methods are sometimes used to help dissolve a cyst. HOME CARE INSTRUCTIONS   Only take  over-the-counter or prescription medicines as directed by your health care provider.  Follow up with your health care provider as directed.  Get regular pelvic exams and Pap tests. SEEK MEDICAL CARE IF:   Your periods are late, irregular, or painful, or they stop.  Your pelvic pain or abdominal pain does not go away.  Your abdomen becomes larger or swollen.  You have pressure on your bladder or trouble emptying your bladder completely.  You have pain during sexual intercourse.  You have feelings of fullness, pressure, or discomfort in your stomach.  You lose weight for no apparent reason.  You feel generally ill.  You become constipated.  You lose your appetite.  You develop acne.  You have an increase in body and facial hair.  You are gaining weight, without changing your exercise and eating habits.  You think you are pregnant. SEEK IMMEDIATE MEDICAL CARE IF:   You have increasing abdominal pain.  You feel sick to your stomach (nauseous), and you throw up (vomit).  You develop a fever that comes on suddenly.  You have abdominal pain during a bowel movement.  Your menstrual periods become heavier than usual. MAKE SURE YOU:  Understand these instructions.  Will watch your condition.  Will get help right away if you are not doing well or get worse.   This information is not intended to replace advice given to you by your health care provider. Make sure you discuss any questions you have with your health care provider.   Document Released: 05/28/2005 Document Revised: 06/02/2013 Document Reviewed: 02/02/2013 Elsevier Interactive Patient Education 2016 [employer]   Bacterial Vaginosis Bacterial vaginosis is a vaginal infection that occurs when the normal balance of bacteria in the vagina is disrupted. It results from an overgrowth of certain bacteria. This is the most common vaginal infection in women of childbearing age. Treatment is important to prevent  complications, especially in pregnant women, as it can cause a premature delivery. CAUSES  Bacterial vaginosis is caused by an increase in harmful bacteria that are normally present in smaller amounts in the vagina. Several different kinds of bacteria can cause bacterial vaginosis. However, the reason that the condition develops is not fully understood. RISK FACTORS Certain activities or behaviors can put you at an increased risk of developing bacterial vaginosis, including:  Having a new sex partner or multiple sex partners.  Douching.  Using an intrauterine device (IUD) for contraception. Women do not get bacterial vaginosis from toilet seats, bedding, swimming pools, or contact with objects around them. SIGNS AND SYMPTOMS  Some women with bacterial vaginosis have no signs or symptoms. Common symptoms include:  Grey vaginal discharge.  A fishlike odor with discharge, especially after sexual intercourse.  Itching or burning of the vagina and vulva.  Burning or pain with urination. DIAGNOSIS  Your health care provider will take a medical history and examine the vagina for signs of bacterial vaginosis. A sample of vaginal fluid may be taken. Your health care provider will look at this sample under a microscope to check for bacteria and abnormal cells. A vaginal pH test may also be done.  TREATMENT  Bacterial vaginosis may be treated with  antibiotic medicines. These may be given in the form of a pill or a vaginal cream. A second round of antibiotics may be prescribed if the condition comes back after treatment. Because bacterial vaginosis increases your risk for sexually transmitted diseases, getting treated can help reduce your risk for chlamydia, gonorrhea, HIV, and herpes. HOME CARE INSTRUCTIONS   Only take over-the-counter or prescription medicines as directed by your health care provider.  If antibiotic medicine was prescribed, take it as directed. Make sure you finish it even if you  start to feel better.  Tell all sexual partners that you have a vaginal infection. They should see their health care provider and be treated if they have problems, such as a mild rash or itching.  During treatment, it is important that you follow these instructions:  Avoid sexual activity or use condoms correctly.  Do not douche.  Avoid alcohol as directed by your health care provider.  Avoid breastfeeding as directed by your health care provider. SEEK MEDICAL CARE IF:   Your symptoms are not improving after 3 days of treatment.  You have increased discharge or pain.  You have a fever. MAKE SURE YOU:   Understand these instructions.  Will watch your condition.  Will get help right away if you are not doing well or get worse. FOR MORE INFORMATION  Centers for Disease Control and Prevention, Division of STD Prevention: [employer] American Sexual Health Association (ASHA): www.ashastd.org    This information is not intended to replace advice given to you by your health care provider. Make sure you discuss any questions you have with your health care provider.   Document Released: 05/28/2005 Document Revised: 06/18/2014 Document Reviewed: 01/07/2013 Elsevier Interactive Patient Education [employer]

## 2015-08-01 NOTE — ED Notes (Signed)
MD at bedside. 

## 2015-08-01 NOTE — ED Provider Notes (Signed)
[facility] Emergency Department Provider Note  ____________________________________________  Time seen: Approximately 453 AM  I have reviewed the triage vital signs and the nursing notes.   HISTORY  Chief Complaint Abdominal Pain    HPI Cynthia Solis is a 40 y.o. female who comes into the hospital today with right lower quadrant abdominal pain. She reports that the pain started around midnight. She tried to sit do a lot of different things moving around to see if it would go away and also tried to wait out the pain but it didn't go away. The patient became concerned because she has a history of a 6 and a meter ovarian cyst for which she is receiving surgery next month and was told that should she have any pain like that she should come in and get checked out for torsion. The patient reports that the pain has been coming and going and is currently a 7 out of 10 in intensity. The patient has some nausea with no vomiting. She denies fevers denies vaginal discharge or pain with urination. The patient reports that the pain was a 9 right before I came in and has come down a little bit.   Past Medical History  Diagnosis Date  . Heart murmur   . Anxiety   . Depression   . Fracture of lumbar spine (HCC)   . History of chicken pox   . Obesity     Patient Active Problem List   Diagnosis Date Noted  . Ovarian cyst 04/05/2015    Past Surgical History  Procedure Laterality Date  . Back surgery  2008    lumbar 4-5 fussion  . Dilation and curettage of uterus  2000  . Diagnostic laparoscopy  2005  . Laparoscopy N/A 04/05/2015    Procedure: LAPAROSCOPY OPERATIVE;  Surgeon: Nadara Mustard, MD;  Location: [facility] ORS;  Service: Gynecology;  Laterality: N/A;  . Laparoscopic salpingo oopherectomy Left 04/05/2015    Procedure: LAPAROSCOPIC SALPINGO OOPHORECTOMY;  Surgeon: Nadara Mustard, MD;  Location: [facility] ORS;  Service: Gynecology;  Laterality: Left;  . Elbow surgery       right elbow tendon repair  . Colonoscopy with propofol N/A 07/14/2015    Procedure: COLONOSCOPY WITH PROPOFOL;  Surgeon: Wallace Cullens, MD;  Location: [facility] ENDOSCOPY;  Service: Gastroenterology;  Laterality: N/A;  . Esophagogastroduodenoscopy N/A 07/14/2015    Procedure: ESOPHAGOGASTRODUODENOSCOPY (EGD);  Surgeon: Wallace Cullens, MD;  Location: [facility] ENDOSCOPY;  Service: Gastroenterology;  Laterality: N/A;    Current Outpatient Rx  Name  Route  Sig  Dispense  Refill  . ALPRAZolam (XANAX) 0.25 MG tablet   Oral   Take 0.25 mg by mouth 3 (three) times daily as needed for anxiety.         Marland Kitchen buPROPion (WELLBUTRIN XL) 300 MG 24 hr tablet   Oral   Take 300 mg by mouth daily.         Marland Kitchen gabapentin (NEURONTIN) 100 MG capsule   Oral   Take 200 mg by mouth 2 (two) times daily. Plus 300 mg at bedtime if needed         . ibuprofen (ADVIL,MOTRIN) 800 MG tablet   Oral   Take 800 mg by mouth every 8 (eight) hours as needed for moderate pain.         . metoprolol tartrate (LOPRESSOR) 25 MG tablet   Oral   Take 25 mg by mouth 2 (two) times daily. 0.5 tablet         .  oxyCODONE-acetaminophen (ROXICET) 5-325 MG tablet   Oral   Take 1 tablet by mouth every 4 (four) hours as needed for moderate pain or severe pain.   40 tablet   0   . pantoprazole (PROTONIX) 40 MG tablet   Oral   Take 40 mg by mouth daily.         . pregabalin (LYRICA) 50 MG capsule   Oral   Take 50 mg by mouth 3 (three) times daily.         . sucralfate (CARAFATE) 1 g tablet   Oral   Take 1 g by mouth 4 (four) times daily -  with meals and at bedtime.         . triamcinolone cream (KENALOG) 0.1 %   Topical   Apply 1 application topically 2 (two) times daily.           Allergies Avelox and Sulfa antibiotics  No family history on file.  Social History Social History  Substance Use Topics  . Smoking status: Never Smoker   . Smokeless tobacco: None  . Alcohol Use: No    Review of  Systems Constitutional: No fever/chills Eyes: No visual changes. ENT: No sore throat. Cardiovascular: Denies chest pain. Respiratory: Denies shortness of breath. Gastrointestinal: abdominal pain and nausea, no vomiting.  No diarrhea.  No constipation. Genitourinary: Negative for dysuria. Musculoskeletal: Negative for back pain. Skin: Negative for rash. Neurological: Negative for headaches, focal weakness or numbness. 10-point ROS otherwise negative.  ____________________________________________   PHYSICAL EXAM:  VITAL SIGNS: ED Triage Vitals  Enc Vitals Group     BP 08/01/15 0128 138/89 mmHg     Pulse Rate 08/01/15 0128 71     Resp 08/01/15 0128 20     Temp 08/01/15 0128 98.1 F (36.7 C)     Temp Source 08/01/15 0128 Oral     SpO2 08/01/15 0128 100 %     Weight 08/01/15 0128 208 lb (94.348 kg)     Height 08/01/15 0128  (1.575 m)     Head Cir --      Peak Flow --      Pain Score 08/01/15 0127 8     Pain Loc --      Pain Edu? --      Excl. in GC? --     Constitutional: Alert and oriented. Well appearing and in moderate distress. Eyes: Conjunctivae are normal. PERRL. EOMI. Head: Atraumatic. Nose: No congestion/rhinnorhea. Mouth/Throat: Mucous membranes are moist.  Oropharynx non-erythematous. Cardiovascular: Normal rate, regular rhythm. Grossly normal heart sounds.  Good peripheral circulation. Respiratory: Normal respiratory effort.  No retractions. Lungs CTAB. Gastrointestinal: Soft some right lower quadrant tenderness to palpation. No distention. Positive bowel sounds Genitourinary: Normal external genitalia with some mild vaginal discharge and right adnexal tenderness to palpation. Musculoskeletal: No lower extremity tenderness nor edema.   Neurologic:  Normal speech and language.  Skin:  Skin is warm, dry and intact. Psychiatric: Mood and affect are normal.   ____________________________________________   LABS (all labs ordered are listed, but only  abnormal results are displayed)  Labs Reviewed  WET PREP, GENITAL - Abnormal; Notable for the following:    Clue Cells Wet Prep HPF POC PRESENT (*)    WBC, Wet Prep HPF POC MODERATE (*)    All other components within normal limits  COMPREHENSIVE METABOLIC PANEL - Abnormal; Notable for the following:    Glucose, Bld 135 (*)    Anion gap 4 (*)    All  other components within normal limits  URINALYSIS COMPLETEWITH MICROSCOPIC ([facility] ONLY) - Abnormal; Notable for the following:    Color, Urine YELLOW (*)    APPearance HAZY (*)    Squamous Epithelial / LPF 6-30 (*)    All other components within normal limits  CHLAMYDIA/NGC RT PCR ([facility] ONLY)  LIPASE, BLOOD  CBC  POC URINE PREG, ED  POCT PREGNANCY, URINE   ____________________________________________  EKG  None ____________________________________________  RADIOLOGY  Pelvic ultrasound: No acute abnormality seen to explain the patient's symptoms, no evidence for ovarian torsion, previously noted right ovarian cyst appears to have largely resolved with involuting follicles at the right ovary measuring up to 2.0 cm, intrauterine device noted in expected position at the fundus of the uterus. ____________________________________________   PROCEDURES  Procedure(s) performed: None  Critical Care performed: No  ____________________________________________   INITIAL IMPRESSION / ASSESSMENT AND PLAN / ED COURSE  Pertinent labs & imaging results that were available during my care of the patient were reviewed by me and considered in my medical decision making (see chart for details).  This is a 40 year old female who comes to the hospital with right lower quadrant abdominal pain. The patient's ultrasound is negative but we will do a CT scan to ensure that the patient does not have appendicitis causing her symptoms. The patient did receive a dose of morphine as well as some Zofran and normal saline. She'll be reassessed once  he received the results of her CT scan.  The patient is awaiting the results of her CT scan. Her care will be signed out to Dr. Shaune Pollack will follow-up the results and disposition the patient. ____________________________________________   FINAL CLINICAL IMPRESSION(S) / ED DIAGNOSES  Final diagnoses:  Bacterial vaginosis  Right lower quadrant abdominal pain      Rebecka Apley, MD 08/01/15 [phone]

## 2015-08-01 NOTE — ED Notes (Signed)
Patient ambulatory to triage with steady gait, without difficulty or distress noted; pt reports right lower abd pain tonight; st has ovarian cyst for which she is sched for surgery but was told to take caution in increased pain for poss torsion

## 2015-08-02 ENCOUNTER — Other Ambulatory Visit: Payer: PRIVATE HEALTH INSURANCE

## 2015-08-02 ENCOUNTER — Encounter: Payer: Self-pay | Admitting: *Deleted

## 2015-08-02 NOTE — Patient Instructions (Signed)
  Your procedure is scheduled on: 08-11-15 (THURSDAY) Report to [facility] SAME DAY SURGERY 2ND FLOOR To find out your arrival time please call [phone] between 1PM - 3PM on 08-10-15 [facility])  Remember: Instructions that are not followed completely may result in serious medical risk, up to and including death, or upon the discretion of your surgeon and anesthesiologist your surgery may need to be rescheduled.    _X___ 1. Do not eat food or drink liquids after midnight. No gum chewing or hard candies.     _X___ 2. No Alcohol for 24 hours before or after surgery.   ____ 3. Bring all medications with you on the day of surgery if instructed.    _X___ 4. Notify your doctor if there is any change in your medical condition     (cold, fever, infections).     Do not wear jewelry, make-up, hairpins, clips or nail polish.  Do not wear lotions, powders, or perfumes. You may wear deodorant.  Do not shave 48 hours prior to surgery. Men may shave face and neck.  Do not bring valuables to the hospital.    [facility] is not responsible for any belongings or valuables.               Contacts, dentures or bridgework may not be worn into surgery.  Leave your suitcase in the car. After surgery it may be brought to your room.  For patients admitted to the hospital, discharge time is determined by your  treatment team.   Patients discharged the day of surgery will not be allowed to drive home.   Please read over the following fact sheets that you were given:      _X___ Take these medicines the morning of surgery with A SIP OF WATER:    1. WELLBUTRIN  2. METOPROLOL  3. LYRICA  4. PROTONIX  5. TAKE AN EXTRA PROTONIX Wednesday NIGHT BEFORE BED  6.  ____ Fleet Enema (as directed)   _X___ Use CHG Soap as directed  ____ Use inhalers on the day of surgery  ____ Stop metformin 2 days prior to surgery    ____ Take 1/2 of usual insulin dose the night before surgery and none on the morning of  surgery.   ____ Stop Coumadin/Plavix/aspirin-N/A  _X___ Stop Anti-inflammatories-STOP IBUPROFEN 7 DAYS PRIOR TO SURGERY-NO NSAIDS OR ASPIRIN PRODUCTS-TYLENOL OK TO TAKE   ____ Stop supplements until after surgery.    ____ Bring C-Pap to the hospital.

## 2015-08-03 ENCOUNTER — Encounter
Admission: RE | Admit: 2015-08-03 | Discharge: 2015-08-03 | Disposition: A | Discharge status: Home / Self Care | Payer: Managed Care, Other (non HMO) | Source: Ambulatory Visit | Attending: Obstetrics & Gynecology | Admitting: Obstetrics & Gynecology

## 2015-08-03 DIAGNOSIS — Z01810 Encounter for preprocedural cardiovascular examination: Secondary | ICD-10-CM | POA: Insufficient documentation

## 2015-08-03 DIAGNOSIS — I4891 Unspecified atrial fibrillation: Secondary | ICD-10-CM

## 2015-08-04 LAB — TYPE AND SCREEN
ABO/RH(D): O POS
ANTIBODY SCREEN: NEGATIVE

## 2015-08-11 ENCOUNTER — Encounter
Admission: RE | Disposition: A | Discharge status: Home / Self Care | Payer: Self-pay | Source: Ambulatory Visit | Attending: Obstetrics & Gynecology

## 2015-08-11 ENCOUNTER — Ambulatory Visit: Payer: Managed Care, Other (non HMO) | Admitting: Anesthesiology

## 2015-08-11 ENCOUNTER — Encounter: Payer: Self-pay | Admitting: *Deleted

## 2015-08-11 ENCOUNTER — Ambulatory Visit
Admission: RE | Admit: 2015-08-11 | Discharge: 2015-08-11 | Disposition: A | Discharge status: Home / Self Care | Payer: Managed Care, Other (non HMO) | Source: Ambulatory Visit | Attending: Obstetrics & Gynecology | Admitting: Obstetrics & Gynecology

## 2015-08-11 DIAGNOSIS — F329 Major depressive disorder, single episode, unspecified: Secondary | ICD-10-CM | POA: Insufficient documentation

## 2015-08-11 DIAGNOSIS — R109 Unspecified abdominal pain: Secondary | ICD-10-CM | POA: Diagnosis not present

## 2015-08-11 DIAGNOSIS — Z302 Encounter for sterilization: Secondary | ICD-10-CM | POA: Diagnosis not present

## 2015-08-11 DIAGNOSIS — N8311 Corpus luteum cyst of right ovary: Secondary | ICD-10-CM | POA: Diagnosis not present

## 2015-08-11 DIAGNOSIS — Z6838 Body mass index (BMI) 38.0-38.9, adult: Secondary | ICD-10-CM | POA: Insufficient documentation

## 2015-08-11 DIAGNOSIS — N83209 Unspecified ovarian cyst, unspecified side: Secondary | ICD-10-CM | POA: Diagnosis present

## 2015-08-11 DIAGNOSIS — R102 Pelvic and perineal pain: Secondary | ICD-10-CM | POA: Diagnosis present

## 2015-08-11 DIAGNOSIS — I4891 Unspecified atrial fibrillation: Secondary | ICD-10-CM | POA: Diagnosis not present

## 2015-08-11 DIAGNOSIS — E669 Obesity, unspecified: Secondary | ICD-10-CM | POA: Insufficient documentation

## 2015-08-11 DIAGNOSIS — F418 Other specified anxiety disorders: Secondary | ICD-10-CM | POA: Diagnosis not present

## 2015-08-11 HISTORY — PX: LAPAROSCOPIC UNILATERAL SALPINGECTOMY: SHX5934

## 2015-08-11 HISTORY — DX: Unspecified atrial flutter: I48.92

## 2015-08-11 HISTORY — PX: LAPAROSCOPIC OVARIAN CYSTECTOMY: SHX6248

## 2015-08-11 LAB — POCT PREGNANCY, URINE: Preg Test, Ur: NEGATIVE

## 2015-08-11 SURGERY — SALPINGECTOMY, UNILATERAL, LAPAROSCOPIC
Anesthesia: General | Laterality: Right | Wound class: Clean Contaminated

## 2015-08-11 MED ORDER — SODIUM CHLORIDE 0.9 % IJ SOLN
INTRAMUSCULAR | Status: AC
  Filled 2015-08-11: qty 10

## 2015-08-11 MED ORDER — BUPIVACAINE HCL (PF) 0.5 % IJ SOLN
INTRAMUSCULAR | Status: DC | PRN
  Administered 2015-08-11: 11 mL

## 2015-08-11 MED ORDER — LACTATED RINGERS IV SOLN
INTRAVENOUS | Status: DC
  Administered 2015-08-11 (×2): via INTRAVENOUS

## 2015-08-11 MED ORDER — EPHEDRINE SULFATE 50 MG/ML IJ SOLN
INTRAMUSCULAR | Status: DC | PRN
Start: 2015-08-11 — End: 2015-08-11
  Administered 2015-08-11 (×2): 5 mg via INTRAVENOUS
  Administered 2015-08-11: 10 mg via INTRAVENOUS

## 2015-08-11 MED ORDER — ROCURONIUM BROMIDE 100 MG/10ML IV SOLN
INTRAVENOUS | Status: DC | PRN
  Administered 2015-08-11: 40 mg via INTRAVENOUS

## 2015-08-11 MED ORDER — NEOSTIGMINE METHYLSULFATE 10 MG/10ML IV SOLN
INTRAVENOUS | Status: DC | PRN
  Administered 2015-08-11: 3 mg via INTRAVENOUS

## 2015-08-11 MED ORDER — PROMETHAZINE HCL 25 MG/ML IJ SOLN
6.2500 mg | Freq: Once | INTRAMUSCULAR | Status: AC
  Administered 2015-08-11: 6.25 mg via INTRAVENOUS

## 2015-08-11 MED ORDER — FENTANYL CITRATE (PF) 100 MCG/2ML IJ SOLN
25.0000 ug | INTRAMUSCULAR | Status: DC | PRN
  Administered 2015-08-11 (×5): 25 ug via INTRAVENOUS

## 2015-08-11 MED ORDER — PROMETHAZINE HCL 25 MG/ML IJ SOLN
INTRAMUSCULAR | Status: AC
  Administered 2015-08-11: 6.25 mg via INTRAVENOUS
  Filled 2015-08-11: qty 1

## 2015-08-11 MED ORDER — LIDOCAINE HCL (CARDIAC) 20 MG/ML IV SOLN
INTRAVENOUS | Status: DC | PRN
  Administered 2015-08-11: 100 mg via INTRAVENOUS

## 2015-08-11 MED ORDER — FENTANYL CITRATE (PF) 100 MCG/2ML IJ SOLN
INTRAMUSCULAR | Status: AC
  Filled 2015-08-11: qty 2

## 2015-08-11 MED ORDER — GLYCOPYRROLATE 0.2 MG/ML IJ SOLN
INTRAMUSCULAR | Status: DC | PRN
  Administered 2015-08-11: 0.4 mg via INTRAVENOUS

## 2015-08-11 MED ORDER — FENTANYL CITRATE (PF) 100 MCG/2ML IJ SOLN
INTRAMUSCULAR | Status: DC | PRN
  Administered 2015-08-11: 100 ug via INTRAVENOUS
  Administered 2015-08-11: 150 ug via INTRAVENOUS

## 2015-08-11 MED ORDER — OXYCODONE-ACETAMINOPHEN 5-325 MG PO TABS: 1.0000 | ORAL_TABLET | ORAL | Status: DC | PRN

## 2015-08-11 MED ORDER — KETOROLAC TROMETHAMINE 30 MG/ML IJ SOLN
INTRAMUSCULAR | Status: DC | PRN
  Administered 2015-08-11: 30 mg via INTRAVENOUS

## 2015-08-11 MED ORDER — FENTANYL CITRATE (PF) 100 MCG/2ML IJ SOLN
INTRAMUSCULAR | Status: AC
  Administered 2015-08-11: 25 ug via INTRAVENOUS
  Filled 2015-08-11: qty 2

## 2015-08-11 MED ORDER — ONDANSETRON HCL 4 MG/2ML IJ SOLN
4.0000 mg | Freq: Once | INTRAMUSCULAR | Status: AC | PRN
  Administered 2015-08-11: 4 mg via INTRAVENOUS

## 2015-08-11 MED ORDER — DEXAMETHASONE SODIUM PHOSPHATE 4 MG/ML IJ SOLN
INTRAMUSCULAR | Status: DC | PRN
  Administered 2015-08-11: 5 mg via INTRAVENOUS

## 2015-08-11 MED ORDER — FLUCONAZOLE 50 MG PO TABS
150.0000 mg | ORAL_TABLET | Freq: Once | ORAL | Status: AC
  Administered 2015-08-11: 150 mg via ORAL
  Filled 2015-08-11: qty 3

## 2015-08-11 MED ORDER — PROPOFOL 10 MG/ML IV BOLUS
INTRAVENOUS | Status: DC | PRN
  Administered 2015-08-11: 150 mg via INTRAVENOUS

## 2015-08-11 MED ORDER — BUPIVACAINE HCL (PF) 0.5 % IJ SOLN
INTRAMUSCULAR | Status: AC
  Filled 2015-08-11: qty 30

## 2015-08-11 MED ORDER — ONDANSETRON HCL 4 MG/2ML IJ SOLN
INTRAMUSCULAR | Status: DC | PRN
  Administered 2015-08-11: 4 mg via INTRAVENOUS

## 2015-08-11 MED ORDER — ONDANSETRON HCL 4 MG/2ML IJ SOLN
INTRAMUSCULAR | Status: AC
  Administered 2015-08-11: 4 mg via INTRAVENOUS
  Filled 2015-08-11: qty 2

## 2015-08-11 MED ORDER — MIDAZOLAM HCL 2 MG/2ML IJ SOLN
INTRAMUSCULAR | Status: DC | PRN
  Administered 2015-08-11: 2 mg via INTRAVENOUS

## 2015-08-11 SURGICAL SUPPLY — 35 items
BLADE SURG SZ11 CARB STEEL (BLADE) ×2 IMPLANT
CANISTER SUCT 1200ML W/VALVE (MISCELLANEOUS) ×2 IMPLANT
CATH ROBINSON RED A/P 16FR (CATHETERS) ×2 IMPLANT
CHLORAPREP W/TINT 26ML (MISCELLANEOUS) ×2 IMPLANT
DRESSING TELFA 4X3 1S ST N-ADH (GAUZE/BANDAGES/DRESSINGS) IMPLANT
ENDOPOUCH RETRIEVER 10 (MISCELLANEOUS) IMPLANT
GAUZE SPONGE NON-WVN 2X2 STRL (MISCELLANEOUS) IMPLANT
GLOVE BIO SURGEON STRL SZ8 (GLOVE) ×2 IMPLANT
GLOVE INDICATOR 8.0 STRL GRN (GLOVE) ×2 IMPLANT
GOWN STRL REUS W/ TWL LRG LVL3 (GOWN DISPOSABLE) ×1 IMPLANT
GOWN STRL REUS W/ TWL XL LVL3 (GOWN DISPOSABLE) ×1 IMPLANT
GOWN STRL REUS W/TWL LRG LVL3 (GOWN DISPOSABLE) ×1
GOWN STRL REUS W/TWL XL LVL3 (GOWN DISPOSABLE) ×1
IRRIGATION STRYKERFLOW (MISCELLANEOUS) IMPLANT
IRRIGATOR STRYKERFLOW (MISCELLANEOUS)
IV LACTATED RINGERS 1000ML (IV SOLUTION) IMPLANT
KIT PINK PAD W/HEAD ARE REST (MISCELLANEOUS) ×2
KIT PINK PAD W/HEAD ARM REST (MISCELLANEOUS) ×1 IMPLANT
LABEL OR SOLS (LABEL) ×2 IMPLANT
LIQUID BAND (GAUZE/BANDAGES/DRESSINGS) ×2 IMPLANT
NEEDLE VERESS 14GA 120MM (NEEDLE) ×2 IMPLANT
NS IRRIG 500ML POUR BTL (IV SOLUTION) ×2 IMPLANT
PACK GYN LAPAROSCOPIC (MISCELLANEOUS) ×2 IMPLANT
PAD PREP 24X41 OB/GYN DISP (PERSONAL CARE ITEMS) ×2 IMPLANT
SCISSORS METZENBAUM CVD 33 (INSTRUMENTS) ×2 IMPLANT
SHEARS HARMONIC ACE PLUS 36CM (ENDOMECHANICALS) ×2 IMPLANT
SLEEVE ENDOPATH XCEL 5M (ENDOMECHANICALS) IMPLANT
SPONGE VERSALON 2X2 STRL (MISCELLANEOUS)
STRAP SAFETY BODY (MISCELLANEOUS) ×2 IMPLANT
SUT VIC AB 2-0 UR6 27 (SUTURE) IMPLANT
SUT VIC AB 4-0 PS2 18 (SUTURE) IMPLANT
SYRINGE 10CC LL (SYRINGE) ×2 IMPLANT
TROCAR ENDO BLADELESS 11MM (ENDOMECHANICALS) IMPLANT
TROCAR XCEL NON-BLD 5MMX100MML (ENDOMECHANICALS) ×2 IMPLANT
TUBING INSUFFLATOR HI FLOW (MISCELLANEOUS) ×2 IMPLANT

## 2015-08-11 NOTE — Op Note (Signed)
  Operative Note   08/11/2015  PRE-OP DIAGNOSIS: Right Ovarian Cyst, RLQ Pelvic Pain, Desire for Sterility   POST-OP DIAGNOSIS: same   PROCEDURE: Procedure(s):  OPERATIVE LAPAROSCOPY LAPAROSCOPIC RIGHT SALPINGECTOMY LAPAROSCOPIC RIGHT OVARIAN CYSTECTOMY REMOVAL OF IUD   SURGEON: Annamarie Major, MD, FACOG  ANESTHESIA: General   ESTIMATED BLOOD LOSS: Min, <10 mL  COMPLICATIONS: None  DISPOSITION: PACU - hemodynamically stable.  CONDITION: stable  FINDINGS: Laparoscopic survey of the abdomen revealed a grossly normal uterus, missing left tube and ovary, cystic right ovary, normal liver edge, gallbladder edge. No intra-abdominal adhesions were noted.   PROCEDURE IN DETAIL: The patient was taken to the OR where anesthesia was administed. The patient was positioned in dorsal lithotomy in the [place] stirrups. The patient was then examined under anesthesia with the above noted findings. The patient was prepped and draped in the normal sterile fashion and foley catheter was placed. A Graves speculum was placed in the vagina and the anterior lip of the cervix was grasped with a single toothed tenaculum. A [place] uterine manipulator was then inserted in the uterus. Uterine mobility was found to be satisfactory. The speculum was then removed.  Attention was turned to the patient's abdomen where a 5 mm skin incision was made in the umbilical fold, after injection of local anesthesia. The Veress step needle was carefully introduced into the peritoneal cavity with placement confirmed using the hanging drop technique.  Pneumoperitoneum was obtained. The 5 mm port was then placed under direct visualization with the operative laparoscope.  Trendelenburg positioning.  Additional 11mm trocar was then placed in the RLQ lateral to the inferior epigastric blood vessels under direct visualization with the laparoscope.  Instrumentation to visualize complete pelvic anatomy performed.  A 5mm trocar was also then placed  in the suprapubic region.  The ovarian cyst is identified and stabilized.  An incision is made with clear fluid noted.  Fluid is aspirated.  Cyst wall is dissected free from the ovarian cortex and removed.  Hemostasis is visualized and assured.  The right fallopian tube is grasped and an excision is performed with the harmonic scapel along the mesosalpinx to completely remove the tube yet preserve ovarian artery blood flow to the ovary.  Tube and cyst wall sent to pathology for further analysis.  Pelvic cavity is cleaned with any fluid aspirated.  Instruments and trocars removed, gas expelled, and skin closed with skin adhesive glue.  Instrument, needle, and sponge counts correct x2 at the conclusion of the case.  Pt goes to recovery room in stable condition.

## 2015-08-11 NOTE — Transfer of Care (Signed)
Immediate Anesthesia Transfer of Care Note  Patient: Cynthia Solis  Procedure(s) Performed: Procedure(s): LAPAROSCOPIC UNILATERAL SALPINGECTOMY (Right) LAPAROSCOPIC OVARIAN CYSTECTOMY (Right)  Patient Location: PACU  Anesthesia Type:General  Level of Consciousness: sedated  Airway & Oxygen Therapy: Patient Spontanous Breathing and Patient connected to face mask oxygen  Post-op Assessment: Report given to RN and Post -op Vital signs reviewed and stable  Post vital signs: Reviewed and stable  Last Vitals:  Filed Vitals:   08/11/15 0612 08/11/15 0839  BP: 108/69 97/58  Pulse: 73 52  Temp: 36.8 C 36.4 C  Resp: 16 20    Complications: No apparent anesthesia complications

## 2015-08-11 NOTE — Anesthesia Procedure Notes (Signed)
Procedure Name: Intubation Date/Time: 08/11/2015 7:31 AM Performed by: Shirlee Limerick, Traquan Duarte Pre-anesthesia Checklist: Patient identified, Emergency Drugs available, Suction available and Patient being monitored Patient Re-evaluated:Patient Re-evaluated prior to inductionOxygen Delivery Method: Circle system utilized Preoxygenation: Pre-oxygenation with 100% oxygen Intubation Type: IV induction Laryngoscope Size: Mac and 3 Grade View: Grade I Tube type: Oral Number of attempts: 1 Placement Confirmation: ETT inserted through vocal cords under direct vision,  positive ETCO2 and breath sounds checked- equal and bilateral Secured at: 21 cm Tube secured with: Tape Dental Injury: Teeth and Oropharynx as per pre-operative assessment

## 2015-08-11 NOTE — Anesthesia Preprocedure Evaluation (Signed)
Anesthesia Evaluation  Patient identified by MRN, date of birth, ID band Patient awake    Reviewed: Allergy & Precautions, NPO status , Patient's Chart, lab work & pertinent test results, reviewed documented beta blocker date and time   Airway Mallampati: III  TM Distance: >3 FB     Dental  (+) Chipped   Pulmonary           Cardiovascular + dysrhythmias Atrial Fibrillation      Neuro/Psych PSYCHIATRIC DISORDERS Anxiety Depression    GI/Hepatic   Endo/Other    Renal/GU      Musculoskeletal   Abdominal   Peds  Hematology   Anesthesia Other Findings Obese.  Reproductive/Obstetrics                             Anesthesia Physical Anesthesia Plan  ASA: III  Anesthesia Plan: General   Post-op Pain Management:    Induction: Intravenous  Airway Management Planned: Oral ETT  Additional Equipment:   Intra-op Plan:   Post-operative Plan:   Informed Consent: I have reviewed the patients History and Physical, chart, labs and discussed the procedure including the risks, benefits and alternatives for the proposed anesthesia with the patient or authorized representative who has indicated his/her understanding and acceptance.     Plan Discussed with: CRNA  Anesthesia Plan Comments:         Anesthesia Quick Evaluation

## 2015-08-11 NOTE — Discharge Instructions (Signed)
General Gynecological Post-Operative Instructions °You may expect to feel dizzy, weak, and drowsy for as long as 24 hours after receiving the medicine that made you sleep (anesthetic).  °Do not drive a car, ride a bicycle, participate in physical activities, or take public transportation until you are done taking narcotic pain medicines or as directed by your doctor.  °Do not drink alcohol or take tranquilizers.  °Do not take medicine that has not been prescribed by your doctor.  °Do not sign important papers or make important decisions while on narcotic pain medicines.  °Have a responsible person with you.  °CARE OF INCISION  °Keep incision clean and dry. °Take showers instead of baths until your doctor gives you permission to take baths.  °Avoid heavy lifting (more than 10 pounds/4.5 kilograms), pushing, or pulling.  °Avoid activities that may risk injury to your surgical site.  °No sexual intercourse or placement of anything in the vagina for 2 weeks or as instructed by your doctor. °If you have tubes coming from the wound site, check with your doctor regarding appropriate care of the tubes. °Only take prescription or over-the-counter medicines  for pain, discomfort, or fever as directed by your doctor. Do not take aspirin. It can make you bleed. Take medicines (antibiotics) that kill germs if they are prescribed for you.  °Call the office or go to the ER if:  °You feel sick to your stomach (nauseous) and you start to throw up (vomit).  °You have trouble eating or drinking.  °You have an oral temperature above 101.  °You have constipation that is not helped by adjusting diet or increasing fluid intake. Pain medicines are a common cause of constipation.  °You have any other concerns. °SEEK IMMEDIATE MEDICAL CARE IF:  °You have persistent dizziness.  °You have difficulty breathing or a congested sounding (croupy) cough.  °You have an oral temperature above 102.5, not controlled by medicine.  °There is increasing  pain or tenderness near or in the surgical site.  ° °AMBULATORY SURGERY  °DISCHARGE INSTRUCTIONS ° ° °1) The drugs that you were given will stay in your system until tomorrow so for the next 24 hours you should not: ° °A) Drive an automobile °B) Make any legal decisions °C) Drink any alcoholic beverage ° ° °2) You may resume regular meals tomorrow.  Today it is better to start with liquids and gradually work up to solid foods. ° °You may eat anything you prefer, but it is better to start with liquids, then soup and crackers, and gradually work up to solid foods. ° ° °3) Please notify your doctor immediately if you have any unusual bleeding, trouble breathing, redness and pain at the surgery site, drainage, fever, or pain not relieved by medication. ° ° ° °4) Additional Instructions: ° ° ° ° ° ° ° °Please contact your physician with any problems or Same Day Surgery at [phone], Monday through Friday 6 am to 4 pm, or [facility] Main number at [phone]. ° ° °

## 2015-08-11 NOTE — Anesthesia Postprocedure Evaluation (Signed)
Anesthesia Post Note  Patient: Cynthia Solis  Procedure(s) Performed: Procedure(s) (LRB): LAPAROSCOPIC UNILATERAL SALPINGECTOMY (Right) LAPAROSCOPIC OVARIAN CYSTECTOMY (Right)  Patient location during evaluation: PACU Anesthesia Type: General Level of consciousness: awake and alert Pain management: pain level controlled Vital Signs Assessment: post-procedure vital signs reviewed and stable Respiratory status: spontaneous breathing, nonlabored ventilation, respiratory function stable and patient connected to nasal cannula oxygen Cardiovascular status: blood pressure returned to baseline and stable Postop Assessment: no signs of nausea or vomiting Anesthetic complications: no    Last Vitals:  Filed Vitals:   08/11/15 1024 08/11/15 1050  BP: 114/76 105/86  Pulse: 54 52  Temp: 35.9 C   Resp: 16     Last Pain:  Filed Vitals:   08/11/15 1052  PainSc: 5                  Fallan Mccarey S

## 2015-08-11 NOTE — H&P (Signed)
History and Physical Interval Note:  08/11/2015 7:07 AM  Cynthia Solis  has presented today for surgery, with the diagnosis of DESIRE FOR PERMANENT STERILITY,ACUTE ABDOMINAL PAIN,OVARIAN CYST  The various methods of treatment have been discussed with the patient and family. After consideration of risks, benefits and other options for treatment, the patient has consented to  Procedure(s): LAPAROSCOPIC UNILATERAL SALPINGECTOMY (Right) LAPAROSCOPIC OVARIAN CYSTECTOMY (Right)  REMOVAL OF INTRAUTERINE DEVICE  as a surgical intervention .  The patient's history has been reviewed, patient examined, no change in status, stable for surgery.  Pt has the following beta blocker history-  Not taking Beta Blocker.  I have reviewed the patient's chart and labs.  Questions were answered to the patient's satisfaction.    Letitia Libra

## 2015-08-12 LAB — SURGICAL PATHOLOGY

## 2016-01-19 ENCOUNTER — Other Ambulatory Visit: Payer: Self-pay | Admitting: Nurse Practitioner

## 2016-01-19 DIAGNOSIS — R1319 Other dysphagia: Secondary | ICD-10-CM

## 2016-01-25 ENCOUNTER — Ambulatory Visit: Payer: PRIVATE HEALTH INSURANCE

## 2016-03-14 ENCOUNTER — Other Ambulatory Visit: Payer: Self-pay | Admitting: Gastroenterology

## 2016-03-14 DIAGNOSIS — R1011 Right upper quadrant pain: Secondary | ICD-10-CM

## 2016-03-14 DIAGNOSIS — R1012 Left upper quadrant pain: Secondary | ICD-10-CM

## 2016-03-27 ENCOUNTER — Other Ambulatory Visit
Admission: RE | Admit: 2016-03-27 | Discharge: 2016-03-27 | Disposition: A | Discharge status: Home / Self Care | Payer: 59 | Source: Ambulatory Visit | Attending: Gastroenterology | Admitting: Gastroenterology

## 2016-03-27 DIAGNOSIS — R112 Nausea with vomiting, unspecified: Secondary | ICD-10-CM | POA: Insufficient documentation

## 2016-03-27 DIAGNOSIS — R197 Diarrhea, unspecified: Secondary | ICD-10-CM | POA: Diagnosis present

## 2016-03-27 LAB — GASTROINTESTINAL PANEL BY PCR, STOOL (REPLACES STOOL CULTURE)
ADENOVIRUS F40/41: NOT DETECTED
Astrovirus: NOT DETECTED
CRYPTOSPORIDIUM: NOT DETECTED
Campylobacter species: NOT DETECTED
Cyclospora cayetanensis: NOT DETECTED
ENTEROAGGREGATIVE E COLI (EAEC): NOT DETECTED
ENTEROPATHOGENIC E COLI (EPEC): NOT DETECTED
Entamoeba histolytica: NOT DETECTED
Enterotoxigenic E coli (ETEC): NOT DETECTED
GIARDIA LAMBLIA: NOT DETECTED
Norovirus GI/GII: NOT DETECTED
Plesimonas shigelloides: NOT DETECTED
ROTAVIRUS A: NOT DETECTED
Salmonella species: NOT DETECTED
Sapovirus (I, II, IV, and V): NOT DETECTED
Shiga like toxin producing E coli (STEC): NOT DETECTED
Shigella/Enteroinvasive E coli (EIEC): NOT DETECTED
Vibrio cholerae: NOT DETECTED
Vibrio species: NOT DETECTED
YERSINIA ENTEROCOLITICA: NOT DETECTED

## 2016-03-27 LAB — C DIFFICILE QUICK SCREEN W PCR REFLEX
C DIFFICILE (CDIFF) INTERP: NOT DETECTED
C DIFFICILE (CDIFF) TOXIN: NEGATIVE
C Diff antigen: NEGATIVE

## 2016-03-29 ENCOUNTER — Ambulatory Visit
Admission: RE | Admit: 2016-03-29 | Discharge: 2016-03-29 | Disposition: A | Discharge status: Home / Self Care | Payer: Managed Care, Other (non HMO) | Source: Ambulatory Visit | Attending: Gastroenterology | Admitting: Gastroenterology

## 2016-03-29 DIAGNOSIS — R1012 Left upper quadrant pain: Secondary | ICD-10-CM | POA: Diagnosis not present

## 2016-03-29 DIAGNOSIS — R1011 Right upper quadrant pain: Secondary | ICD-10-CM | POA: Diagnosis present

## 2016-03-29 MED ORDER — TECHNETIUM TC 99M MEBROFENIN IV KIT
5.0000 | PACK | Freq: Once | INTRAVENOUS | Status: AC | PRN
  Administered 2016-03-29: 5 via INTRAVENOUS

## 2016-04-03 ENCOUNTER — Encounter: Payer: Self-pay | Admitting: General Surgery

## 2016-04-03 ENCOUNTER — Ambulatory Visit (INDEPENDENT_AMBULATORY_CARE_PROVIDER_SITE_OTHER): Payer: Managed Care, Other (non HMO) | Admitting: General Surgery

## 2016-04-03 VITALS — BP 124/78 | HR 80 | Resp 12 | Ht 62.0 in | Wt 199.0 lb

## 2016-04-03 DIAGNOSIS — R1011 Right upper quadrant pain: Secondary | ICD-10-CM

## 2016-04-03 NOTE — Progress Notes (Signed)
Patient ID: Cynthia SeashoreMandy K Solis, female   DOB: 04/16/1976, 40 y.o.   MRN: [id number]  Chief Complaint  Patient presents with  . Abdominal Pain    HPI Cynthia Solis is a 40 y.o. female.  Here today for evaluation of abdominal pain. HIDA scan done 03/29/2016.When she was having her HIDA scan done when laying on the table patient states she was having pain. She has been having abdominal pain in her right upper quadrant for 3 months now. She has been moving her bowels 3 three times a day and the stool is yellow.  Her step-son Jolyn NapJalen was present at visit.   The patient has experienced weight loss during this time secondary to food avoidance.  The patient is a Engineer, civil (consulting)nurse at hospice.  HPI  Past Medical History:  Diagnosis Date  . Anxiety   . Atrial flutter (HCC)   . Depression   . Fracture of lumbar spine (HCC)   . Heart murmur   . History of chicken pox   . Obesity     Past Surgical History:  Procedure Laterality Date  . BACK SURGERY  2008   lumbar 4-5 fussion  . COLONOSCOPY WITH PROPOFOL N/A 07/14/2015   Procedure: COLONOSCOPY WITH PROPOFOL;  Surgeon: Wallace CullensPaul Y Oh, MD;  [facility];  Service: Gastroenterology;  Laterality: N/A;  . DIAGNOSTIC LAPAROSCOPY  2005  . DILATION AND CURETTAGE OF UTERUS  2000  . ELBOW SURGERY     right elbow tendon repair  . ESOPHAGOGASTRODUODENOSCOPY N/A 07/14/2015   Procedure: ESOPHAGOGASTRODUODENOSCOPY (EGD);  Surgeon: Wallace CullensPaul Y Oh, MD;  [facility];  Service: Gastroenterology;  Laterality: N/A;  . LAPAROSCOPIC OVARIAN CYSTECTOMY Right 08/11/2015   Procedure: LAPAROSCOPIC OVARIAN CYSTECTOMY;  Surgeon: Nadara Mustardobert P Harris, MD;  Location: [facility] ORS;  Service: Gynecology;  Laterality: Right;  . LAPAROSCOPIC SALPINGO OOPHERECTOMY Left 04/05/2015   Procedure: LAPAROSCOPIC SALPINGO OOPHORECTOMY;  Surgeon: Nadara Mustardobert P Harris, MD;  Location: [facility] ORS;  Service: Gynecology;  Laterality: Left;  . LAPAROSCOPIC UNILATERAL SALPINGECTOMY Right 08/11/2015   Procedure:  LAPAROSCOPIC UNILATERAL SALPINGECTOMY;  Surgeon: Nadara Mustardobert P Harris, MD;  Location: [facility] ORS;  Service: Gynecology;  Laterality: Right;  . LAPAROSCOPY N/A 04/05/2015   Procedure: LAPAROSCOPY OPERATIVE;  Surgeon: Nadara Mustardobert P Harris, MD;  Location: [facility] ORS;  Service: Gynecology;  Laterality: N/A;    No family history on file.  Social History Social History  Substance Use Topics  . Smoking status: Never Smoker  . Smokeless tobacco: Never Used  . Alcohol use No    Allergies  Allergen Reactions  . Avelox [Moxifloxacin Hcl In Nacl] Rash  . Sulfa Antibiotics Rash    Current Outpatient Prescriptions  Medication Sig Dispense Refill  . ALPRAZolam (XANAX) 0.25 MG tablet Take 0.25 mg by mouth 3 (three) times daily as needed for anxiety.    Marland Kitchen. buPROPion (WELLBUTRIN XL) 300 MG 24 hr tablet Take 300 mg by mouth every morning.     . DULoxetine (CYMBALTA) 30 MG capsule Take 40 mg by mouth daily.     . metoprolol tartrate (LOPRESSOR) 25 MG tablet Take 25 mg by mouth 2 (two) times daily. 0.5 tablet    . pantoprazole (PROTONIX) 40 MG tablet Take 40 mg by mouth every morning.     Marland Kitchen. ibuprofen (ADVIL,MOTRIN) 800 MG tablet Take 1 tablet (800 mg total) by mouth every 8 (eight) hours as needed. (Patient not taking: Reported on 04/03/2016) 30 tablet 0   No current facility-administered medications for this visit.     Review  of Systems Review of Systems  Constitutional: Negative.   Respiratory: Negative.   Cardiovascular: Negative.   Gastrointestinal: Positive for abdominal distention, nausea and vomiting.    Blood pressure 124/78, pulse 80, resp. rate 12, height 5\' 2"  (1.575 m), weight 199 lb (90.3 kg), last menstrual period 03/19/2016.  Physical Exam Physical Exam  Constitutional: She is oriented to person, place, and time. She appears well-developed and well-nourished.  Eyes: Conjunctivae are normal. No scleral icterus.  Neck: Neck supple.  Cardiovascular: Normal rate, regular rhythm and normal  heart sounds.   Pulmonary/Chest: Effort normal and breath sounds normal.  Abdominal: Soft. Bowel sounds are normal. There is no tenderness.    Lymphadenopathy:    She has no cervical adenopathy.  Neurological: She is alert and oriented to person, place, and time.  Skin: Skin is warm and dry.  Psychiatric: Her behavior is normal.    Data Reviewed GI notes from [place] Snare, [name] @[facility]  cl[facility] from October 17 reviewed.  Laboratory studies of October 2017 showed a mild elevation of the serum transaminases at less than 2 times normal. Normal alkaline phosphatase and bilirubin. Normal albumin. Normal electrolytes. White blood cell count of 8400 with a hemoglobin of 14.2 and MCV of 88.8. Slight increase in eosinophils at 5.3%. H. pylori testing was negative.  HIDA scan with stimulation of 03/30/2016 showed a low ejection fraction of 15%. Patient reports she had typical right upper quadrant pain within 15 minutes of ingesting the Ensure used for gallbladder stimulation.  03/22/2016 CT reviewed. No obvious pathology in the right upper quadrant.  Abdominal ultrasound of 03/22/2016 grossly unremarkable.  Abdominal ultrasound of 01/22/2016 suggests a possible thickening of the gallbladder wall likely secondary to nondistended status. No pericholecystic fluid identified.  Upper lower endoscopy from February 2017 completed for rectal bleeding was negative. Biopsies of the stomach showed evidence of proton pump inhibitor, no evidence of H. pylori.  Assessment    Symptoms suggestive of chronic cholecystitis without cholelithiasis.    Plan    Likelihood of improvement with elective cholecystectomy reviewed, approximately 75% of people have relief of their symptoms if they have a low ejection fraction and reproduction of pain with stimulation.  I don't believe that the mild serum transaminase elevations are related to her biliary tract. More likely secondary to fat deposition.     Laparoscopic Cholecystectomy with Intraoperative Cholangiogram. The procedure, including it's potential risks and complications (including but not limited to infection, bleeding, injury to intra-abdominal organs or bile ducts, bile leak, poor cosmetic result, sepsis and death) were discussed with the patient in detail. Non-operative options were discussed as well. The patient expressed and understanding of what we discussed and wishes to proceed with laparoscopic cholecystectomy. The patient further understands that if it is technically not possible, or it is unsafe to proceed laparoscopically, that I will convert to an open cholecystectomy.  The patient is scheduled for surgery at [facility] on 04/18/16. She will pre admit by phone. The patient is aware of date and instructions.   This information has been scribed by Dorathy Daft RN, BSN,BC.   Earline Mayotte 04/04/2016, 4:39 PM

## 2016-04-03 NOTE — Patient Instructions (Addendum)
Laparoscopic Cholecystectomy Laparoscopic cholecystectomy is surgery to remove the gallbladder. The gallbladder is located in the upper right part of the abdomen, behind the liver. It is a storage sac for bile, which is produced in the liver. Bile aids in the digestion and absorption of fats. Cholecystectomy is often done for inflammation of the gallbladder (cholecystitis). This condition is usually caused by a buildup of gallstones (cholelithiasis) in the gallbladder. Gallstones can block the flow of bile, and that can result in inflammation and pain. In severe cases, emergency surgery may be required. If emergency surgery is not required, you will have time to prepare for the procedure. Laparoscopic surgery is an alternative to open surgery. Laparoscopic surgery has a shorter recovery time. Your common bile duct may also need to be examined during the procedure. If stones are found in the common bile duct, they may be removed. LET YOUR HEALTH CARE PROVIDER KNOW ABOUT:  Any allergies you have.  All medicines you are taking, including vitamins, herbs, eye drops, creams, and over-the-counter medicines.  Previous problems you or members of your family have had with the use of anesthetics.  Any blood disorders you have.  Previous surgeries you have had.  Any medical conditions you have. RISKS AND COMPLICATIONS Generally, this is a safe procedure. However, problems may occur, including:  Infection.  Bleeding.  Allergic reactions to medicines.  Damage to other structures or organs.  A stone remaining in the common bile duct.  A bile leak from the cyst duct that is clipped when your gallbladder is removed.  The need to convert to open surgery, which requires a larger incision in the abdomen. This may be necessary if your surgeon thinks that it is not safe to continue with a laparoscopic procedure. BEFORE THE PROCEDURE  Ask your health care provider about:  Changing or stopping your  regular medicines. This is especially important if you are taking diabetes medicines or blood thinners.  Taking medicines such as aspirin and ibuprofen. These medicines can thin your blood. Do not take these medicines before your procedure if your health care provider instructs you not to.  Follow instructions from your health care provider about eating or drinking restrictions.  Let your health care provider know if you develop a cold or an infection before surgery.  Plan to have someone take you home after the procedure.  Ask your health care provider how your surgical site will be marked or identified.  You may be given antibiotic medicine to help prevent infection. PROCEDURE  To reduce your risk of infection:  Your health care team will wash or sanitize their hands.  Your skin will be washed with soap.  An IV tube may be inserted into one of your veins.  You will be given a medicine to make you fall asleep (general anesthetic).  A breathing tube will be placed in your mouth.  The surgeon will make several small cuts (incisions) in your abdomen.  A thin, lighted tube (laparoscope) that has a tiny camera on the end will be inserted through one of the small incisions. The camera on the laparoscope will send a picture to a TV screen (monitor) in the operating room. This will give the surgeon a good view inside your abdomen.  A gas will be pumped into your abdomen. This will expand your abdomen to give the surgeon more room to perform the surgery.  Other tools that are needed for the procedure will be inserted through the other incisions. The gallbladder will   be removed through one of the incisions.  After your gallbladder has been removed, the incisions will be closed with stitches (sutures), staples, or skin glue.  Your incisions may be covered with a bandage (dressing). The procedure may vary among health care providers and hospitals. AFTER THE PROCEDURE  Your blood  pressure, heart rate, breathing rate, and blood oxygen level will be monitored often until the medicines you were given have worn off.  You will be given medicines as needed to control your pain.   This information is not intended to replace advice given to you by your health care provider. Make sure you discuss any questions you have with your health care provider.   Document Released: 05/28/2005 Document Revised: 02/16/2015 Document Reviewed: 01/07/2013 Elsevier Interactive Patient Education [employer]2016 Elsevier Inc.  The patient is [facility]. She will pre admit by phone. The patient is aware of date and instructions.

## 2016-04-04 DIAGNOSIS — R1011 Right upper quadrant pain: Secondary | ICD-10-CM | POA: Insufficient documentation

## 2016-04-12 ENCOUNTER — Encounter
Admission: RE | Admit: 2016-04-12 | Discharge: 2016-04-12 | Disposition: A | Discharge status: Home / Self Care | Payer: Managed Care, Other (non HMO) | Source: Ambulatory Visit | Attending: General Surgery | Admitting: General Surgery

## 2016-04-12 HISTORY — DX: Personal history of urinary calculi: Z87.442

## 2016-04-12 HISTORY — DX: Nonrheumatic mitral (valve) insufficiency: I34.0

## 2016-04-12 HISTORY — DX: Cardiac arrhythmia, unspecified: I49.9

## 2016-04-12 HISTORY — DX: Gastro-esophageal reflux disease without esophagitis: K21.9

## 2016-04-12 NOTE — Pre-Procedure Instructions (Signed)
Cynthia Solis, Alexander, MD - 12/15/2015 9:30 AM EDT Formatting of this note may be different from the original. Established Patient Visit   Chief Complaint: Chief Complaint  Patient presents with  . Follow-up  [facility] heart racing, CP , Nausea  . Chest Pain  pressure/tightness-took extra metoprolol  . Shortness of Breath  cant catch breath  Date of Service: 12/15/2015 Date of Birth: [birth year] PCP: DAVID Launa GrillMARVIN BRONSTEIN, MD  History of Present Illness: Cynthia Solis is a 40 y.o.female patient who returns for palpitations, chest discomfort, and mitral regurgitation. The patient reports a 2 year history of recurrent episodes of palpitations, due to PVCs. The patient was initially started on metoprolol tartrate 12.5 mg twice daily, increased to 25 mg twice daily during her last office visit 08/18/2015. The [facility]/2015 for palpitations at which time she was treated with an additional dose of metoprolol. Since then, the patient has continued to have intermittent episodes of palpitations, increase metoprolol tartrate 50 mg twice daily overall clinical improvement. 2D echocardiogram 07/26/2014 revealed normal left ventricular function, with LV ejection fraction greater than 55% with mild to moderate mitral regurgitation.  Past Medical and Surgical History  Past Medical History Past Medical History:  Diagnosis Date  . Anxiety  . Depression  . Fracture of lumbar spine (CMS-HCC) 1998 or 1999  . History of chicken pox  . Obesity   Past Surgical History She has a past surgical history that includes Dilation and curettage, diagnostic / therapeutic (2000); elbow surgery (2011); Colonoscopy (07/14/2015); and egd (07/14/2015).   Medications and Allergies  Current Medications  Current Outpatient Prescriptions  Medication Sig Dispense Refill  . ALPRAZolam (XANAX) 0.25 MG tablet Take 1 tablet (0.25 mg total) by mouth 3 (three) times daily as needed for Sleep. 20 tablet 0  . buPROPion  (WELLBUTRIN XL) 300 MG XL tablet TAKE 1 TABLET EVERY DAY 90 tablet 1  . DULoxetine 40 mg CpDR Take 40 mg by mouth once daily. 30 capsule 2  . gabapentin (NEURONTIN) 100 MG capsule TAKE 2 CAPSULES BY MOUTH TWICE A DAY 120 capsule 1  . HYDROcodone-acetaminophen (NORCO) 5-325 mg tablet Take 1 tablet by mouth every 4 (four) hours as needed for Pain for up to 20 doses. 20 tablet 0  . hydrocortisone 2.5 % ointment Apply topically 2 (two) times daily as needed. 30 g 1  . ibuprofen (ADVIL,MOTRIN) 800 MG tablet Take 1 tablet (800 mg total) by mouth every 8 (eight) hours as needed for Pain. 120 tablet 5  . metoprolol tartrate (LOPRESSOR) 25 MG tablet Take 2 tablets (50 mg total) by mouth 2 (two) times daily. 60 tablet 5  . ondansetron (ZOFRAN-ODT) 8 MG disintegrating tablet TAKE 1 TABLET (8 MG TOTAL) BY MOUTH EVERY 8 (EIGHT) HOURS AS NEEDED FOR NAUSEA FOR UP TO 7 DAYS. 20 tablet 0  . pantoprazole (PROTONIX) 40 MG DR tablet Take 1 tablet (40 mg total) by mouth 2 (two) times daily. Take 15-20 mins before meal 60 tablet 2  . polyethylene glycol (MIRALAX) powder Mix entire bottle in 64 ounces of Gatorade and take per colonoscopy prep instructions. 255 g 0  . pregabalin (LYRICA) 75 MG capsule Take 1 capsule (75 mg total) by mouth 3 (three) times daily. 270 capsule 1  . triamcinolone 0.1 % cream APPLY A SMALL AMOUNT TO AFFECTED AREA TWICE A DAY AS NEEDED 60 g 1   No current facility-administered medications for this visit.   Allergies: Avelox [moxifloxacin] and Sulfa (sulfonamide antibiotics)  Social and Family History  Social History reports that she has never smoked. She has never used smokeless tobacco. She reports that she does not drink alcohol or use illicit drugs.  Family History Family History  Problem Relation Age of Onset  . Breast cancer Mother 3733  Both breasts, again at 7149  . Hyperlipidemia Father  . Coronary artery disease Maternal Grandmother  . Breast cancer Maternal Grandmother   Review  of Systems   Review of Systems: The patient denied chest pain, shortness of breath, orthopnea, paroxysmal nocturnal dyspnea, pedal edema, reports intermittent palpitations, heart racing, without presyncope, syncope. Review of 12 Systems is negative except as described above.  Physical Examination   Vitals: BP (P) 102/60  Pulse (P) 56  Ht (P) 157.5 cm (5\' 2" )  Wt (P) 93.9 kg (207 lb)  BMI (P) 37.86 kg/m2 Ht:(P) 157.5 cm (5\' 2" ) Wt:(P) 93.9 kg (207 lb) YNW:GNFABSA:Body surface area is 2.03 meters squared (pended). Body mass index is 37.86 kg/(m^2) (pended).  HEENT: Pupils equally reactive to light and accomodation  Neck: Supple without thyromegaly, carotid pulses 2+ Lungs: clear to auscultation bilaterally; no wheezes, rales, rhonchi Heart: Regular rate and rhythm. Grade 1 to 2/6 systolic murmur Abdomen: soft nontender, nondistended, with normal bowel sounds Extremities: no cyanosis, clubbing, or edema Peripheral Pulses: 2+ in all extremities, 2+ femoral pulses bilaterally  Assessment   40 y.o. female with  1. Premature ventricular contractions  2. 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 The patient has a history of mild to moderate mitral regurgitation, clinically stable.  Plan   1. Continue current medications 2. Counseled patient about low-sodium diet 3. DASH diet printed instructions given to the patient 4. Continue metoprolol tartrate 50 mg twice daily 6. Counseled patient about limiting her caffeine intake 7. Return to clinic for follow-up in 4 months  No orders of the defined types were placed in this encounter.  Return in about 4 months (around 04/16/2016).  Cynthia MillardALEXANDER PARASCHOS, MD

## 2016-04-12 NOTE — Pre-Procedure Instructions (Signed)
ECG 12 Lead6/08/2015 [facility] Component Name Value Ref Range  EKG Ventricular Rate 103 BPM   EKG Atrial Rate 103 BPM   EKG P-R Interval 168 ms  EKG QRS Duration 64 ms  EKG Q-T Interval 326 ms  EKG QTC Calculation 427 ms  EKG Calculated P Axis 59 degrees   EKG Calculated R Axis 46 degrees   EKG Calculated T Axis 32 degrees   Result Narrative  SINUS TACHYCARDIA OTHERWISE NORMAL ECG NO PREVIOUS ECGS AVAILABLE Confirmed by SMITHMD, SIDNEY ([id number]) on 11/13/2015 2:29:02 PM  Status Results Details    Hospital Encounter on [facility]")' href="[email or web address]/">Encounter Summary

## 2016-04-12 NOTE — Patient Instructions (Signed)
  Your procedure is scheduled on: 04-18-16  Report to Same Day Surgery 2nd floor medical mall To find out your arrival time please call [phone] between 1PM - 3PM on 04-17-16  Remember: Instructions that are not followed completely may result in serious medical risk, up to and including death, or upon the discretion of your surgeon and anesthesiologist your surgery may need to be rescheduled.    _x___ 1. Do not eat food or drink liquids after midnight. No gum chewing or hard candies.     __x__ 2. No Alcohol for 24 hours before or after surgery.   __x__3. No Smoking for 24 prior to surgery.   ____  4. Bring all medications with you on the day of surgery if instructed.    __x__ 5. Notify your doctor if there is any change in your medical condition     (cold, fever, infections).     Do not wear jewelry, make-up, hairpins, clips or nail polish.  Do not wear lotions, powders, or perfumes. You may wear deodorant.  Do not shave 48 hours prior to surgery. Men may shave face and neck.  Do not bring valuables to the hospital.    [facility] is not responsible for any belongings or valuables.               Contacts, dentures or bridgework may not be worn into surgery.  Leave your suitcase in the car. After surgery it may be brought to your room.  For patients admitted to the hospital, discharge time is determined by your treatment team.   Patients discharged the day of surgery will not be allowed to drive home.    Please read over the following fact sheets that you were given:   [facility] Preparing for Surgery and or MRSA Information   _x___ Take these medicines the morning of surgery with A SIP OF WATER:    1. CYMBALTA  2. METOPROLOL  3. PROTONIX  4. MAY TAKE XANAX DAY OF SURGERY IF NEEDED  5.  6.  ____Fleets enema or Magnesium Citrate as directed.   ____ Use CHG Soap or sage wipes as directed on instruction sheet   ____ Use inhalers on the day of surgery and bring to  hospital day of surgery  ____ Stop metformin 2 days prior to surgery    ____ Take 1/2 of usual insulin dose the night before surgery and none on the morning of  surgery.   ____ Stop aspirin or coumadin, or plavix  __ Stop Anti-inflammatories such as Advil, Aleve, Ibuprofen, Motrin, Naproxen,          Naprosyn, Goodies powders or aspirin products. Ok to take Tylenol.   ____ Stop supplements until after surgery.    ____ Bring C-Pap to the hospital.

## 2016-04-17 ENCOUNTER — Encounter: Payer: Self-pay | Admitting: *Deleted

## 2016-04-18 ENCOUNTER — Ambulatory Visit
Admission: RE | Admit: 2016-04-18 | Discharge: 2016-04-18 | Disposition: A | Discharge status: Home / Self Care | Payer: Managed Care, Other (non HMO) | Source: Ambulatory Visit | Attending: General Surgery | Admitting: General Surgery

## 2016-04-18 ENCOUNTER — Encounter: Payer: Self-pay | Admitting: *Deleted

## 2016-04-18 DIAGNOSIS — K811 Chronic cholecystitis: Secondary | ICD-10-CM | POA: Insufficient documentation

## 2016-04-18 DIAGNOSIS — Z538 Procedure and treatment not carried out for other reasons: Secondary | ICD-10-CM | POA: Insufficient documentation

## 2016-04-18 DIAGNOSIS — F419 Anxiety disorder, unspecified: Secondary | ICD-10-CM | POA: Diagnosis not present

## 2016-04-18 DIAGNOSIS — I4892 Unspecified atrial flutter: Secondary | ICD-10-CM | POA: Diagnosis not present

## 2016-04-18 DIAGNOSIS — Z6835 Body mass index (BMI) 35.0-35.9, adult: Secondary | ICD-10-CM | POA: Diagnosis not present

## 2016-04-18 DIAGNOSIS — Z79899 Other long term (current) drug therapy: Secondary | ICD-10-CM | POA: Insufficient documentation

## 2016-04-18 DIAGNOSIS — Z881 Allergy status to other antibiotic agents status: Secondary | ICD-10-CM | POA: Diagnosis not present

## 2016-04-18 DIAGNOSIS — K219 Gastro-esophageal reflux disease without esophagitis: Secondary | ICD-10-CM | POA: Diagnosis not present

## 2016-04-18 DIAGNOSIS — F329 Major depressive disorder, single episode, unspecified: Secondary | ICD-10-CM | POA: Insufficient documentation

## 2016-04-18 DIAGNOSIS — E669 Obesity, unspecified: Secondary | ICD-10-CM | POA: Diagnosis not present

## 2016-04-18 LAB — POCT PREGNANCY, URINE: PREG TEST UR: NEGATIVE

## 2016-04-18 MED ORDER — SODIUM CHLORIDE 0.9 % IJ SOLN
INTRAMUSCULAR | Status: AC
  Filled 2016-04-18: qty 50

## 2016-04-18 MED ORDER — LACTATED RINGERS IV SOLN
INTRAVENOUS | Status: DC
  Administered 2016-04-18: 50 mL/h via INTRAVENOUS

## 2016-04-18 MED ORDER — SODIUM CHLORIDE 0.9 % IJ SOLN
INTRAMUSCULAR | Status: AC
  Filled 2016-04-18: qty 10

## 2016-04-18 MED ORDER — ACETAMINOPHEN 10 MG/ML IV SOLN
INTRAVENOUS | Status: AC
  Filled 2016-04-18: qty 100

## 2016-04-18 NOTE — H&P (Signed)
No change in history or exam. For cholecystectomy.

## 2016-04-18 NOTE — OR Nursing (Signed)
Pt discharged. Surgery rescheduled for 730am 11/9.

## 2016-04-18 NOTE — OR Nursing (Signed)
Pt to have surgery at 730am in stead of later tonight. IV discontinued due to pt compaints of discomfort. Site clear. Pt informed of NPO after midnight and to be here at 615am 04/19/16.

## 2016-04-19 ENCOUNTER — Ambulatory Visit: Payer: Managed Care, Other (non HMO)

## 2016-04-19 ENCOUNTER — Encounter: Payer: Self-pay | Admitting: *Deleted

## 2016-04-19 ENCOUNTER — Ambulatory Visit: Payer: Managed Care, Other (non HMO) | Admitting: Anesthesiology

## 2016-04-19 ENCOUNTER — Encounter
Admission: EM | Disposition: A | Discharge status: Home / Self Care | Payer: Self-pay | Source: Ambulatory Visit | Attending: General Surgery

## 2016-04-19 ENCOUNTER — Ambulatory Visit
Admission: EM | Admit: 2016-04-19 | Discharge: 2016-04-19 | Disposition: A | Discharge status: Home / Self Care | Payer: Managed Care, Other (non HMO) | Source: Ambulatory Visit | Attending: General Surgery | Admitting: General Surgery

## 2016-04-19 DIAGNOSIS — Z881 Allergy status to other antibiotic agents status: Secondary | ICD-10-CM | POA: Insufficient documentation

## 2016-04-19 DIAGNOSIS — E669 Obesity, unspecified: Secondary | ICD-10-CM | POA: Diagnosis not present

## 2016-04-19 DIAGNOSIS — Z6835 Body mass index (BMI) 35.0-35.9, adult: Secondary | ICD-10-CM | POA: Insufficient documentation

## 2016-04-19 DIAGNOSIS — I4892 Unspecified atrial flutter: Secondary | ICD-10-CM | POA: Diagnosis not present

## 2016-04-19 DIAGNOSIS — Z882 Allergy status to sulfonamides status: Secondary | ICD-10-CM | POA: Diagnosis not present

## 2016-04-19 DIAGNOSIS — R011 Cardiac murmur, unspecified: Secondary | ICD-10-CM | POA: Insufficient documentation

## 2016-04-19 DIAGNOSIS — K819 Cholecystitis, unspecified: Secondary | ICD-10-CM

## 2016-04-19 DIAGNOSIS — F329 Major depressive disorder, single episode, unspecified: Secondary | ICD-10-CM | POA: Insufficient documentation

## 2016-04-19 DIAGNOSIS — K219 Gastro-esophageal reflux disease without esophagitis: Secondary | ICD-10-CM | POA: Diagnosis not present

## 2016-04-19 DIAGNOSIS — F419 Anxiety disorder, unspecified: Secondary | ICD-10-CM | POA: Diagnosis not present

## 2016-04-19 DIAGNOSIS — K8044 Calculus of bile duct with chronic cholecystitis without obstruction: Secondary | ICD-10-CM | POA: Diagnosis not present

## 2016-04-19 DIAGNOSIS — K811 Chronic cholecystitis: Secondary | ICD-10-CM | POA: Diagnosis present

## 2016-04-19 HISTORY — PX: CHOLECYSTECTOMY: SHX55

## 2016-04-19 SURGERY — LAPAROSCOPIC CHOLECYSTECTOMY WITH INTRAOPERATIVE CHOLANGIOGRAM
Anesthesia: General | Wound class: Clean Contaminated

## 2016-04-19 MED ORDER — ACETAMINOPHEN 10 MG/ML IV SOLN
INTRAVENOUS | Status: DC | PRN
  Administered 2016-04-19: 1000 mg via INTRAVENOUS

## 2016-04-19 MED ORDER — KETOROLAC TROMETHAMINE 30 MG/ML IJ SOLN
INTRAMUSCULAR | Status: DC | PRN
  Administered 2016-04-19: 30 mg via INTRAVENOUS

## 2016-04-19 MED ORDER — PROPOFOL 10 MG/ML IV BOLUS
INTRAVENOUS | Status: DC | PRN
  Administered 2016-04-19: 150 mg via INTRAVENOUS

## 2016-04-19 MED ORDER — PROMETHAZINE HCL 25 MG/ML IJ SOLN
INTRAMUSCULAR | Status: AC
  Administered 2016-04-19: 12.5 mg via INTRAVENOUS
  Filled 2016-04-19: qty 1

## 2016-04-19 MED ORDER — PROMETHAZINE HCL 25 MG/ML IJ SOLN
12.5000 mg | Freq: Four times a day (QID) | INTRAMUSCULAR | Status: DC | PRN
  Administered 2016-04-19: 12.5 mg via INTRAVENOUS
  Filled 2016-04-19: qty 1

## 2016-04-19 MED ORDER — SODIUM CHLORIDE 0.9 % IJ SOLN
INTRAMUSCULAR | Status: AC
  Filled 2016-04-19: qty 10

## 2016-04-19 MED ORDER — HYDROCODONE-ACETAMINOPHEN 5-325 MG PO TABS
1.0000 | ORAL_TABLET | Freq: Once | ORAL | Status: AC
  Administered 2016-04-19: 1 via ORAL

## 2016-04-19 MED ORDER — GLYCOPYRROLATE 0.2 MG/ML IJ SOLN
INTRAMUSCULAR | Status: DC | PRN
  Administered 2016-04-19: 0.6 mg via INTRAVENOUS

## 2016-04-19 MED ORDER — MIDAZOLAM HCL 2 MG/2ML IJ SOLN
INTRAMUSCULAR | Status: DC | PRN
  Administered 2016-04-19: 2 mg via INTRAVENOUS

## 2016-04-19 MED ORDER — FENTANYL CITRATE (PF) 100 MCG/2ML IJ SOLN
25.0000 ug | INTRAMUSCULAR | Status: DC | PRN
  Administered 2016-04-19 (×3): 25 ug via INTRAVENOUS

## 2016-04-19 MED ORDER — LIDOCAINE HCL (CARDIAC) 20 MG/ML IV SOLN
INTRAVENOUS | Status: DC | PRN
  Administered 2016-04-19: 30 mg via INTRAVENOUS

## 2016-04-19 MED ORDER — DEXAMETHASONE SODIUM PHOSPHATE 10 MG/ML IJ SOLN
INTRAMUSCULAR | Status: DC | PRN
  Administered 2016-04-19: 10 mg via INTRAVENOUS

## 2016-04-19 MED ORDER — ONDANSETRON HCL 4 MG/2ML IJ SOLN
INTRAMUSCULAR | Status: DC | PRN
  Administered 2016-04-19: 4 mg via INTRAVENOUS

## 2016-04-19 MED ORDER — HYDROCODONE-ACETAMINOPHEN 5-325 MG PO TABS: 1.0000 | ORAL_TABLET | ORAL | 0 refills | Status: DC | PRN

## 2016-04-19 MED ORDER — ONDANSETRON HCL 4 MG/2ML IJ SOLN
INTRAMUSCULAR | Status: AC
  Filled 2016-04-19: qty 2

## 2016-04-19 MED ORDER — HYDROCODONE-ACETAMINOPHEN 5-325 MG PO TABS
ORAL_TABLET | ORAL | Status: AC
  Administered 2016-04-19: 1 via ORAL
  Filled 2016-04-19: qty 1

## 2016-04-19 MED ORDER — NEOSTIGMINE METHYLSULFATE 10 MG/10ML IV SOLN
INTRAVENOUS | Status: DC | PRN
  Administered 2016-04-19: 1 mg via INTRAVENOUS
  Administered 2016-04-19: 4 mg via INTRAVENOUS

## 2016-04-19 MED ORDER — ROCURONIUM BROMIDE 100 MG/10ML IV SOLN
INTRAVENOUS | Status: DC | PRN
  Administered 2016-04-19: 30 mg via INTRAVENOUS
  Administered 2016-04-19: 10 mg via INTRAVENOUS

## 2016-04-19 MED ORDER — FENTANYL CITRATE (PF) 100 MCG/2ML IJ SOLN
INTRAMUSCULAR | Status: AC
  Administered 2016-04-19: 25 ug via INTRAVENOUS
  Filled 2016-04-19: qty 2

## 2016-04-19 MED ORDER — ONDANSETRON HCL 4 MG/2ML IJ SOLN
4.0000 mg | Freq: Once | INTRAMUSCULAR | Status: AC | PRN
  Administered 2016-04-19: 4 mg via INTRAVENOUS

## 2016-04-19 MED ORDER — LACTATED RINGERS IV SOLN
INTRAVENOUS | Status: DC
  Administered 2016-04-19: 100 mL/h via INTRAVENOUS

## 2016-04-19 MED ORDER — FENTANYL CITRATE (PF) 100 MCG/2ML IJ SOLN
INTRAMUSCULAR | Status: DC | PRN
  Administered 2016-04-19: 150 ug via INTRAVENOUS

## 2016-04-19 MED ORDER — SODIUM CHLORIDE 0.9 % IV SOLN
INTRAVENOUS | Status: DC | PRN
  Administered 2016-04-19: 6 mL

## 2016-04-19 SURGICAL SUPPLY — 38 items
APPLIER CLIP ROT 10 11.4 M/L (STAPLE) ×2
BLADE SURG 11 STRL SS SAFETY (MISCELLANEOUS) ×2 IMPLANT
CANISTER SUCT 1200ML W/VALVE (MISCELLANEOUS) ×2 IMPLANT
CANNULA DILATOR 10 W/SLV (CANNULA) ×2 IMPLANT
CANNULA DILATOR 5 W/SLV (CANNULA) ×4 IMPLANT
CATH CHOLANG 76X19 KUMAR (CATHETERS) ×2 IMPLANT
CHLORAPREP W/TINT 26ML (MISCELLANEOUS) ×2 IMPLANT
CLIP APPLIE ROT 10 11.4 M/L (STAPLE) ×1 IMPLANT
CONRAY 60ML FOR OR (MISCELLANEOUS) ×2 IMPLANT
DISSECTOR KITTNER STICK (MISCELLANEOUS) IMPLANT
DISSECTORS/KITTNER STICK (MISCELLANEOUS)
DRAPE SHEET LG 3/4 BI-LAMINATE (DRAPES) ×2 IMPLANT
DRESSING TELFA 4X3 1S ST N-ADH (GAUZE/BANDAGES/DRESSINGS) ×2 IMPLANT
DRSG TEGADERM 2-3/8X2-3/4 SM (GAUZE/BANDAGES/DRESSINGS) ×8 IMPLANT
ELECT REM PT RETURN 9FT ADLT (ELECTROSURGICAL) ×2
ELECTRODE REM PT RTRN 9FT ADLT (ELECTROSURGICAL) ×1 IMPLANT
ENDOPOUCH RETRIEVER 10 (MISCELLANEOUS) IMPLANT
GLOVE BIO SURGEON STRL SZ7.5 (GLOVE) ×8 IMPLANT
GLOVE INDICATOR 8.0 STRL GRN (GLOVE) ×6 IMPLANT
GOWN STRL REUS W/ TWL LRG LVL3 (GOWN DISPOSABLE) ×3 IMPLANT
GOWN STRL REUS W/TWL LRG LVL3 (GOWN DISPOSABLE) ×3
IRRIGATION STRYKERFLOW (MISCELLANEOUS) ×1 IMPLANT
IRRIGATOR STRYKERFLOW (MISCELLANEOUS) ×2
IV LACTATED RINGERS 1000ML (IV SOLUTION) ×2 IMPLANT
KIT RM TURNOVER STRD PROC AR (KITS) ×2 IMPLANT
LABEL OR SOLS (LABEL) ×2 IMPLANT
NDL INSUFF ACCESS 14 VERSASTEP (NEEDLE) ×2 IMPLANT
NS IRRIG 500ML POUR BTL (IV SOLUTION) ×2 IMPLANT
PACK LAP CHOLECYSTECTOMY (MISCELLANEOUS) ×2 IMPLANT
SCISSORS METZENBAUM CVD 33 (INSTRUMENTS) ×2 IMPLANT
SEAL FOR SCOPE WARMER C3101 (MISCELLANEOUS) IMPLANT
STRIP CLOSURE SKIN 1/2X4 (GAUZE/BANDAGES/DRESSINGS) ×2 IMPLANT
SUT VIC AB 0 CT2 27 (SUTURE) ×2 IMPLANT
SUT VIC AB 4-0 FS2 27 (SUTURE) ×2 IMPLANT
SWABSTK COMLB BENZOIN TINCTURE (MISCELLANEOUS) ×2 IMPLANT
TROCAR XCEL NON-BLD 11X100MML (ENDOMECHANICALS) ×2 IMPLANT
TUBING INSUFFLATOR HI FLOW (MISCELLANEOUS) ×2 IMPLANT
WATER STERILE IRR 1000ML POUR (IV SOLUTION) ×2 IMPLANT

## 2016-04-19 NOTE — OR Nursing (Signed)
Pt c/o 10/10 abdominal incision pain and nausea.  Pt left on the stretcher to rest. Spoke with Dr. Maisie Fushomas reagrding above.  No orders received at this time.  Call him back in 30 min.

## 2016-04-19 NOTE — OR Nursing (Signed)
Pt reports nausea is much better, out of bed to bathroom, voided large amount of clear yellow urine.  Spoke with Dr. Lemar LivingsByrnett, gave him an update on pt, doing much better and is wanting to go home.  Dr. Lemar LivingsByrnett OK'D pt dc home.

## 2016-04-19 NOTE — Progress Notes (Signed)
Called dr Maisie Fusthomas heart rate 45  Blood pressure good  Answers questions appropriately   No new orders at this time

## 2016-04-19 NOTE — Progress Notes (Signed)
Heart rate down to 39  Sleepy after fentanyl  Stopping fentanyl      Makes pt sleep to hard with no help to relief pain

## 2016-04-19 NOTE — Progress Notes (Signed)
Very sleepy with fentanyl

## 2016-04-19 NOTE — Progress Notes (Signed)
Nausea better taking ice chips  

## 2016-04-19 NOTE — Anesthesia Preprocedure Evaluation (Signed)
Anesthesia Evaluation  Patient identified by MRN, date of birth, ID band Patient awake    Reviewed: Allergy & Precautions, NPO status , Patient's Chart, lab work & pertinent test results, reviewed documented beta blocker date and time   Airway Mallampati: III  TM Distance: >3 FB     Dental  (+) Chipped   Pulmonary           Cardiovascular + dysrhythmias      Neuro/Psych PSYCHIATRIC DISORDERS Anxiety Depression    GI/Hepatic GERD  Controlled,  Endo/Other    Renal/GU      Musculoskeletal   Abdominal   Peds  Hematology   Anesthesia Other Findings Obese.  Reproductive/Obstetrics                             Anesthesia Physical Anesthesia Plan  ASA: III  Anesthesia Plan: General   Post-op Pain Management:    Induction: Intravenous  Airway Management Planned: Oral ETT  Additional Equipment:   Intra-op Plan:   Post-operative Plan:   Informed Consent: I have reviewed the patients History and Physical, chart, labs and discussed the procedure including the risks, benefits and alternatives for the proposed anesthesia with the patient or authorized representative who has indicated his/her understanding and acceptance.     Plan Discussed with: CRNA  Anesthesia Plan Comments:         Anesthesia Quick Evaluation

## 2016-04-19 NOTE — OR Nursing (Signed)
Dr. Lemar LivingsByrnett called to check on pt, told him the pt was nauseated and had 10/10 pain.  He ordered Phenergan 12.5 mg IVP.

## 2016-04-19 NOTE — Anesthesia Postprocedure Evaluation (Signed)
Anesthesia Post Note  Patient: Lalita K Seng  Procedure(s) Performed: Procedure(s) (LRB): LAPAROSCOPIC CHOLECYSTECTOMY WITH INTRAOPERATIVE CHOLANGIOGRAM (N/A)  Patient location during evaluation: PACU Anesthesia Type: General Level of consciousness: awake and alert Pain management: pain level controlled Vital Signs Assessment: post-procedure vital signs reviewed and stable Respiratory status: spontaneous breathing, nonlabored ventilation, respiratory function stable and patient connected to nasal cannula oxygen Cardiovascular status: blood pressure returned to baseline and stable Postop Assessment: no signs of nausea or vomiting Anesthetic complications: no    Last Vitals:  Vitals:   04/19/16 0953 04/19/16 1015  BP: 118/73   Pulse: (!) 49 (!) 46  Resp: (!) 22 17  Temp:  36.2 C    Last Pain:  Vitals:   04/19/16 1015  TempSrc:   PainSc: 10-Worst pain ever                 Jamesia Linnen S

## 2016-04-19 NOTE — OR Nursing (Signed)
Pt reports nausea is better, abdominal incision pain is 8/10 that comes and goes.

## 2016-04-19 NOTE — Progress Notes (Signed)
Called out to front desk to update family

## 2016-04-19 NOTE — Progress Notes (Signed)
zofran given for nausea  

## 2016-04-19 NOTE — Anesthesia Procedure Notes (Signed)
Procedure Name: Intubation Date/Time: 04/19/2016 7:42 AM Performed by: Omer JackWEATHERLY, Cynthia Shoultz Pre-anesthesia Checklist: Patient identified, Patient being monitored, Timeout performed, Emergency Drugs available and Suction available Patient Re-evaluated:Patient Re-evaluated prior to inductionOxygen Delivery Method: Circle system utilized Preoxygenation: Pre-oxygenation with 100% oxygen Intubation Type: IV induction Ventilation: Mask ventilation without difficulty Laryngoscope Size: Mac and 3 Grade View: Grade I Tube type: Oral Tube size: 7.0 mm Number of attempts: 1 Placement Confirmation: ETT inserted through vocal cords under direct vision,  positive ETCO2 and breath sounds checked- equal and bilateral Secured at: 21 cm Tube secured with: Tape Dental Injury: Teeth and Oropharynx as per pre-operative assessment

## 2016-04-19 NOTE — Transfer of Care (Signed)
Immediate Anesthesia Transfer of Care Note  Patient: Cynthia Solis  Procedure(s) Performed: Procedure(s): LAPAROSCOPIC CHOLECYSTECTOMY WITH INTRAOPERATIVE CHOLANGIOGRAM (N/A)  Patient Location: PACU  Anesthesia Type:General  Level of Consciousness: patient cooperative and lethargic  Airway & Oxygen Therapy: Patient Spontanous Breathing and Patient connected to face mask oxygen  Post-op Assessment: Report given to RN and Post -op Vital signs reviewed and stable  Post vital signs: Reviewed and stable  Last Vitals:  Vitals:   04/19/16 0708 04/19/16 0840  BP: 117/72 118/76  Pulse: (!) 57 (!) 53  Resp: 12 18  Temp: 36.5 C (!) (P) 36.1 C    Last Pain:  Vitals:   04/19/16 0708  TempSrc: Oral  PainSc: 0-No pain         Complications: No apparent anesthesia complications

## 2016-04-19 NOTE — Discharge Instructions (Signed)

## 2016-04-19 NOTE — H&P (Signed)
Surgery postponed yesterday due to delay with emergency cases. No change in clinical history or exam. For cholecystectomy.

## 2016-04-19 NOTE — Op Note (Signed)
Preoperative diagnosis: Chronic cholecystitis.  Postoperative diagnoses: Chronic cholecystitis and cholelithiasis.  Operative procedure: Lap Cholecystectomy with intraoperative cholangiograms.  Operative surgeon: Lane HackerJeffery Byrnett, M.D.  Anesthesia: Gen. endotracheal.  Estimated blood loss: Less than 5 mL.  Clinical note: This 40 year old has had episodic right upper quadrant pain especially after fatty meals. Ultrasound was negative. HIDA scan showed a depressed ejection fraction of 15% with reproduction of her symptoms. She was an anaphylactic cholecystectomy.  Operative note: With the patient under adequate general endotracheal anesthesia the abdomen was prepped with ChloraPrep and draped. An Trendelenburg positioning various needle was placed with transmural umbilical incision. The hanging drop test was utilized and after assuring intra-abdominal location and the abdomen was insufflated with CO2 a 10 mmHg pressure. A 10 mm Port was expanded and inspection showed no evidence of injury from initial port placement. No adhesions for previous laps procedures.  The patient was placed in reverse Trendelenburg position and rolled to the left. An 11 mm XL port was placed in the epigastrium and a direct vision. 2-5 mm ports were placed in the right lateral abdominal wall. The gallbladder was noted to have lost its normal "Robins egg blue" color, being a dusky L. This was placed on cephalad traction. The neck of the gallbladder was cleared. Fluoroscopic glandular grams were completed using 6 mL of one half strength and rate 60. This showed free flow into the right and left hepatic ducts and free flow of the duodenum. The cystic duct and branches of the cystic artery were doubly clipped and divided. The gallbladder was removed from the liver bed making use of hook cautery dissection. It was delivered to the middle port site. The open specimen showed marked chronic cholesterolosis as well as a single 1.5 mm  stone. The right upper quadrant was irrigated with lactated Ringer's solution. The abdomen was then desufflated and ports removed under direct vision. Skin incisions were closed with 4-0 Vicryl subcuticular sutures. Enzyme, Steri-Strips, Telfa and Tegaderm dressings were applied.  The patient tolerated the procedure well and was taken recovery room in stable condition.

## 2016-04-20 LAB — SURGICAL PATHOLOGY

## 2016-04-25 ENCOUNTER — Encounter: Payer: Self-pay | Admitting: General Surgery

## 2016-04-25 ENCOUNTER — Ambulatory Visit (INDEPENDENT_AMBULATORY_CARE_PROVIDER_SITE_OTHER): Payer: Managed Care, Other (non HMO) | Admitting: General Surgery

## 2016-04-25 VITALS — BP 128/76 | HR 80 | Resp 12 | Ht 62.0 in | Wt 200.0 lb

## 2016-04-25 DIAGNOSIS — K810 Acute cholecystitis: Secondary | ICD-10-CM

## 2016-04-25 MED ORDER — ONDANSETRON 4 MG PO TBDP: 4.0000 mg | ORAL_TABLET | Freq: Three times a day (TID) | ORAL | 0 refills | Status: DC | PRN

## 2016-04-25 NOTE — Progress Notes (Signed)
Patient ID: Cynthia SeashoreMandy K Alatorre, female   DOB: 1975/10/18, 40 y.o.   MRN: [id number]  Chief Complaint  Patient presents with  . Routine Post Op    lap chole    HPI Cynthia Solis is a 40 y.o. female here today for her post op cholecystectomy removal done on 04/19/2016. Patient states she is doing well. Minimal pain at incisions. She does have some nausea with regular meals and diarrhea with a fatty meal.The right upper quadrant pain she had previously experienced has resolved.  HPI  Past Medical History:  Diagnosis Date  . Anxiety   . Atrial flutter (HCC)   . Depression   . Dysrhythmia    H/O ATRIAL FLUTTER-PT STATES METOPROLOL DOSE HAS BEEN INCREASED AND PALPITATIONS ARE NOW UNDER CONTROL  . Fracture of lumbar spine (HCC)   . GERD (gastroesophageal reflux disease)   . Heart murmur    since a child  . History of chicken pox   . History of kidney stones   . Mitral regurgitation   . Obesity     Past Surgical History:  Procedure Laterality Date  . BACK SURGERY  2008   lumbar 4-5 fusion, titanium  . CHOLECYSTECTOMY N/A 04/19/2016   Procedure: LAPAROSCOPIC CHOLECYSTECTOMY WITH INTRAOPERATIVE CHOLANGIOGRAM;  Surgeon: Earline MayotteJeffrey W Tilmon Wisehart, MD;  Location: [facility] ORS;  Service: General;  Laterality: N/A;  . COLONOSCOPY WITH PROPOFOL N/A 07/14/2015   Procedure: COLONOSCOPY WITH PROPOFOL;  Surgeon: Wallace CullensPaul Y Oh, MD;  Location[facility];  Service: Gastroenterology;  Laterality: N/A;  . DIAGNOSTIC LAPAROSCOPY  2005  . DILATION AND CURETTAGE OF UTERUS  2000  . ELBOW SURGERY     right elbow tendon repair  . ESOPHAGOGASTRODUODENOSCOPY N/A 07/14/2015   Procedure: ESOPHAGOGASTRODUODENOSCOPY (EGD);  Surgeon: Wallace CullensPaul Y Oh, MD;  Location: [facility];  Service: Gastroenterology;  Laterality: N/A;  . LAPAROSCOPIC OVARIAN CYSTECTOMY Right 08/11/2015   Procedure: LAPAROSCOPIC OVARIAN CYSTECTOMY;  Surgeon: Nadara Mustardobert P Harris, MD;  Location: [facility] ORS;  Service: Gynecology;  Laterality: Right;  . LAPAROSCOPIC  SALPINGO OOPHERECTOMY Left 04/05/2015   Procedure: LAPAROSCOPIC SALPINGO OOPHORECTOMY;  Surgeon: Nadara Mustardobert P Harris, MD;  Location: [facility] ORS;  Service: Gynecology;  Laterality: Left;  . LAPAROSCOPIC UNILATERAL SALPINGECTOMY Right 08/11/2015   Procedure: LAPAROSCOPIC UNILATERAL SALPINGECTOMY;  Surgeon: Nadara Mustardobert P Harris, MD;  Location: [facility] ORS;  Service: Gynecology;  Laterality: Right;  . LAPAROSCOPY N/A 04/05/2015   Procedure: LAPAROSCOPY OPERATIVE;  Surgeon: Nadara Mustardobert P Harris, MD;  Location: [facility] ORS;  Service: Gynecology;  Laterality: N/A;    No family history on file.  Social History Social History  Substance Use Topics  . Smoking status: Never Smoker  . Smokeless tobacco: Never Used  . Alcohol use No    Allergies  Allergen Reactions  . Avelox [Moxifloxacin Hcl In Nacl] Rash  . Sulfa Antibiotics Rash    Current Outpatient Prescriptions  Medication Sig Dispense Refill  . ALPRAZolam (XANAX) 0.25 MG tablet Take 0.25 mg by mouth 3 (three) times daily as needed for anxiety.    Marland Kitchen. buPROPion (WELLBUTRIN XL) 300 MG 24 hr tablet Take 300 mg by mouth every morning.     . dicyclomine (BENTYL) 10 MG capsule Take 10 mg by mouth 4 (four) times daily -  before meals and at bedtime.    . DULoxetine (CYMBALTA) 20 MG capsule Take 40 mg by mouth every morning.     Marland Kitchen. HYDROcodone-acetaminophen (NORCO) 5-325 MG tablet Take 1-2 tablets by mouth every 4 (four) hours as needed for moderate pain. 30  tablet 0  . metoprolol (LOPRESSOR) 50 MG tablet Take 50 mg by mouth 2 (two) times daily.    . pantoprazole (PROTONIX) 40 MG tablet Take 40 mg by mouth 2 (two) times daily.     . ondansetron (ZOFRAN ODT) 4 MG disintegrating tablet Take 1 tablet (4 mg total) by mouth every 8 (eight) hours as needed for nausea or vomiting. 40 tablet 0   No current facility-administered medications for this visit.     Review of Systems Review of Systems  Constitutional: Negative.   Respiratory: Negative.   Cardiovascular: Negative.    Gastrointestinal: Positive for diarrhea and nausea. Negative for constipation.    Blood pressure 128/76, pulse 80, resp. rate 12, height 5\' 2"  (1.575 m), weight 200 lb (90.7 kg), last menstrual period 04/14/2016.  Physical Exam Physical Exam  Constitutional: She is oriented to person, place, and time. She appears well-developed and well-nourished.  Cardiovascular: Normal rate, regular rhythm and normal heart sounds.   Pulmonary/Chest: Effort normal and breath sounds normal.  Abdominal: Soft. There is no tenderness.  Port sites are clean and healing well   Neurological: She is alert and oriented to person, place, and time.  Skin: Skin is warm and dry.  Psychiatric: Her behavior is normal.    Data Reviewed DIAGNOSIS:  A. GALLBLADDER; CHOLECYSTECTOMY:  - CHRONIC CHOLECYSTITIS.  - POLYPOID CHOLESTEROLOSIS.  - NEGATIVE FOR DYSPLASIA AND MALIGNANCY. DIAGNOSIS:   Intraoperative examination of the gallbladder showed a 1.5 mm stone as well as chronic cholesterolosis.   Assessment    Significant improvement in abdominal symptoms postcholecystectomy.    Plan    The patient will report if her nausea has not resolved by the end of the month.     Return as needed.  Proper lifting techniques reviewed. Resume activities as tolerated. Aleve or Advil as needed for comfort.  Zofran 4 mg #40 as needed.   The patient is aware to call back for any questions or concerns.    Earline MayotteByrnett, Josedaniel Haye W 04/25/2016, 7:52 PM

## 2016-04-25 NOTE — Patient Instructions (Addendum)
Return as needed. Proper lifting techniques reviewed. Resume activities as tolerated. Aleve or Advil as needed for comfort. The patient is aware to call back for any questions or concerns.

## 2016-04-26 ENCOUNTER — Encounter: Payer: Self-pay | Admitting: General Surgery

## 2016-04-26 ENCOUNTER — Telehealth: Payer: Self-pay | Admitting: *Deleted

## 2016-04-26 NOTE — Telephone Encounter (Signed)
Work note is available.

## 2016-05-18 ENCOUNTER — Other Ambulatory Visit: Payer: Self-pay | Admitting: Obstetrics & Gynecology

## 2016-05-18 DIAGNOSIS — Z1231 Encounter for screening mammogram for malignant neoplasm of breast: Secondary | ICD-10-CM

## 2016-06-11 HISTORY — PX: ABDOMINAL HYSTERECTOMY: SHX81

## 2016-06-22 ENCOUNTER — Ambulatory Visit
Admission: RE | Admit: 2016-06-22 | Discharge: 2016-06-22 | Disposition: A | Discharge status: Home / Self Care | Payer: Managed Care, Other (non HMO) | Source: Ambulatory Visit | Attending: Obstetrics & Gynecology | Admitting: Obstetrics & Gynecology

## 2016-06-22 ENCOUNTER — Encounter: Payer: Self-pay | Admitting: Radiology

## 2016-06-22 DIAGNOSIS — Z1231 Encounter for screening mammogram for malignant neoplasm of breast: Secondary | ICD-10-CM | POA: Diagnosis not present

## 2016-06-22 DIAGNOSIS — Z1371 Encounter for nonprocreative screening for genetic disease carrier status: Secondary | ICD-10-CM

## 2016-06-22 HISTORY — DX: Encounter for nonprocreative screening for genetic disease carrier status: Z13.71

## 2016-10-03 ENCOUNTER — Ambulatory Visit: Payer: Managed Care, Other (non HMO) | Admitting: Obstetrics and Gynecology

## 2016-10-05 NOTE — Progress Notes (Signed)
This encounter was created in error - please disregard.

## 2016-10-12 NOTE — Telephone Encounter (Signed)
This encounter was created in error - please disregard.

## 2016-10-12 NOTE — Progress Notes (Signed)
Please disregard this abstract encounter.

## 2017-04-05 ENCOUNTER — Telehealth: Payer: Self-pay

## 2017-04-05 NOTE — Telephone Encounter (Signed)
Please schedule

## 2017-04-05 NOTE — Telephone Encounter (Signed)
Pt is schedule 04/19/17 fr u/s and follow up

## 2017-04-05 NOTE — Telephone Encounter (Signed)
Appt w GYN US first is fine

## 2017-04-05 NOTE — Telephone Encounter (Signed)
Pt states she had an ablation in January. She hasn't had any bleeding up until August. She is having bleeding that doesn't last long (2-3 days) but has clots. It is associated w/severe bilateral lower abdominal pain that lasts longer than the bleeding. States blood smells like afterbirth bleeding. [phone]#[phone] or work [phone] ok to leave message on either #.

## 2017-04-12 ENCOUNTER — Other Ambulatory Visit: Payer: Self-pay | Admitting: Obstetrics & Gynecology

## 2017-04-12 DIAGNOSIS — R1031 Right lower quadrant pain: Secondary | ICD-10-CM

## 2017-04-12 DIAGNOSIS — R1032 Left lower quadrant pain: Principal | ICD-10-CM

## 2017-04-19 ENCOUNTER — Ambulatory Visit (INDEPENDENT_AMBULATORY_CARE_PROVIDER_SITE_OTHER): Payer: BC Managed Care – PPO | Admitting: Obstetrics & Gynecology

## 2017-04-19 ENCOUNTER — Ambulatory Visit (INDEPENDENT_AMBULATORY_CARE_PROVIDER_SITE_OTHER): Payer: BC Managed Care – PPO

## 2017-04-19 ENCOUNTER — Encounter: Payer: Self-pay | Admitting: Obstetrics & Gynecology

## 2017-04-19 VITALS — BP 120/80 | HR 67 | Ht 62.0 in | Wt 206.0 lb

## 2017-04-19 DIAGNOSIS — N921 Excessive and frequent menstruation with irregular cycle: Secondary | ICD-10-CM | POA: Diagnosis not present

## 2017-04-19 DIAGNOSIS — R1031 Right lower quadrant pain: Secondary | ICD-10-CM

## 2017-04-19 DIAGNOSIS — N946 Dysmenorrhea, unspecified: Secondary | ICD-10-CM | POA: Insufficient documentation

## 2017-04-19 DIAGNOSIS — R1032 Left lower quadrant pain: Secondary | ICD-10-CM

## 2017-04-19 DIAGNOSIS — Z9889 Other specified postprocedural states: Secondary | ICD-10-CM | POA: Diagnosis not present

## 2017-04-19 MED ORDER — MELOXICAM 15 MG PO TABS: 15.0000 mg | ORAL_TABLET | Freq: Two times a day (BID) | ORAL | 3 refills | Status: DC | PRN

## 2017-04-19 NOTE — Progress Notes (Signed)
HPI: Dysfunctional Uterine Bleeding Patient complains of irregular menses.  She is s/p ablation last January, with no bleeding until August.  She has been bleeding irregularly since. She is now bleeding every 14-28 days and menses are lasting several days. She changes her pad or tampon every a few hours. Clots are moderate in size.  Also has odorous d/c. Dysmenorrhea:severe, occurring throughout menses.  Feels like ctxs. Cyclic symptoms include: none. Current contraception: tubal ligation. History of infertility: no. History of abnormal Pap smear: no.   Ultrasound demonstrates endometrial findings that arfe thickened and w cystic spaces and fluid collections.  Post-ablative effect. These findings are abnormal  PMHx: She  has a past medical history of Anxiety, Atrial flutter ([place]), BRCA gene mutation negative in female (06/22/2016), Depression, Dysrhythmia, Family history of breast cancer (08/2014), Family history of ovarian cancer (03/28/2015), Fracture of lumbar spine ([place]), GERD (gastroesophageal reflux disease), Heart murmur, History of abnormal mammogram (06/27/2014), History of chicken pox, History of kidney stones, History of Papanicolaou smear of cervix (04/20/2014), Mitral regurgitation, Obesity, and Pelvic pain. Also,  has a past surgical history that includes Elbow surgery; Back surgery (2008); Diagnostic laparoscopy (2005); Endometrial ablation; Dilation and curettage of uterus (2000); Induced abortion; Ablation; LAPAROSCOPIC CHOLECYSTECTOMY WITH INTRAOPERATIVE CHOLANGIOGRAM (N/A, 04/19/2016); LAPAROSCOPIC UNILATERAL SALPINGECTOMY (Right, 08/11/2015); LAPAROSCOPIC OVARIAN CYSTECTOMY (Right, 08/11/2015); COLONOSCOPY WITH PROPOFOL (N/A, 07/14/2015); ESOPHAGOGASTRODUODENOSCOPY (EGD) (N/A, 07/14/2015); LAPAROSCOPY OPERATIVE (N/A, 04/05/2015); and LAPAROSCOPIC SALPINGO OOPHORECTOMY (Left, 04/05/2015)., family history includes Breast cancer in her cousin; Breast cancer (age of onset: 77) in her maternal  grandmother and mother; Breast cancer (age of onset: 23) in her maternal aunt; CVA in her maternal grandmother; Cancer in her paternal grandfather; Dementia in her maternal grandfather and maternal grandmother; Diabetes in her maternal grandfather and maternal grandmother; Heart attack in her paternal grandfather; Hyperlipidemia in her father; Hypertension in her father, maternal grandfather, maternal grandmother, and paternal grandfather; Ovarian cancer (age of onset: 94) in her mother.,  reports that  has never smoked. she has never used smokeless tobacco. She reports that she drinks alcohol. She reports that she does not use drugs.  She has a current medication list which includes the following prescription(s): alprazolam, bupropion, hydrocortisone, metoprolol tartrate, ondansetron, pantoprazole, triamcinolone cream, alprazolam, dicyclomine, duloxetine, gabapentin, hydrocodone-acetaminophen, and meloxicam. Also, is allergic to avelox [moxifloxacin hcl in nacl] and sulfa antibiotics.  Review of Systems  Constitutional: Negative for chills, fever and malaise/fatigue.  HENT: Negative for congestion, sinus pain and sore throat.   Eyes: Negative for blurred vision and pain.  Respiratory: Negative for cough and wheezing.   Cardiovascular: Negative for chest pain and leg swelling.  Gastrointestinal: Negative for abdominal pain, constipation, diarrhea, heartburn, nausea and vomiting.  Genitourinary: Negative for dysuria, frequency, hematuria and urgency.  Musculoskeletal: Negative for back pain, joint pain, myalgias and neck pain.  Skin: Negative for itching and rash.  Neurological: Negative for dizziness, tremors and weakness.  Endo/Heme/Allergies: Does not bruise/bleed easily.  Psychiatric/Behavioral: Negative for depression. The patient is not nervous/anxious and does not have insomnia.    Objective: BP 120/80   Pulse 67   Ht '5\' 2"'  (1.575 m)   Wt 206 lb (93.4 kg)   BMI 37.68 kg/m   Physical  examination Constitutional NAD, Conversant  Skin No rashes, lesions or ulceration.   Extremities: Moves all appropriately.  Normal ROM for age. No lymphadenopathy.  Neuro: Grossly intact  Psych: Oriented to PPT.  Normal mood. Normal affect.   Assessment: Likely effect of ablation and fluid entrapment with ctx  like pain  Dysmenorrhea  Status post endometrial ablation  Menometrorrhagia  Discussed options-    Hormonal therapy, hysterectomy as options.    Do not rec repeat ablation; cannot do IUD.    Pros and cons of TLH discussed.  Preserve her one ovary.    Pt to consider.  A total of 15 minutes were spent face-to-face with the patient during this encounter and over half of that time dealt with counseling and coordination of care.   Barnett Applebaum, MD, [facility], [facility] 04/19/2017  3:24 PM

## 2017-04-19 NOTE — Patient Instructions (Signed)
Total Laparoscopic Hysterectomy °A total laparoscopic hysterectomy is a minimally invasive surgery to remove your uterus and cervix. This surgery is performed by making several small cuts (incisions) in your abdomen. It can also be done with a thin, lighted tube (laparoscope) inserted into two small incisions in your lower abdomen. Your fallopian tubes and ovaries can be removed (bilateral salpingo-oophorectomy) during this surgery as well. Benefits of minimally invasive surgery include: °· Less pain. °· Less risk of blood loss. °· Less risk of infection. °· Quicker return to normal activities. ° °Tell a health care provider about: °· Any allergies you have. °· All medicines you are taking, including vitamins, herbs, eye drops, creams, and over-the-counter medicines. °· Any problems you or family members have had with anesthetic medicines. °· Any blood disorders you have. °· Any surgeries you have had. °· Any medical conditions you have. °What are the risks? °Generally, this is a safe procedure. However, as with any procedure, complications can occur. Possible complications include: °· Bleeding. °· Blood clots in the legs or lung. °· Infection. °· Injury to surrounding organs. °· Problems with anesthesia. °· Early menopause symptoms (hot flashes, night sweats, insomnia). °· Risk of conversion to an open abdominal incision. ° °What happens before the procedure? °· Ask your health care provider about changing or stopping your regular medicines. °· Do not take aspirin or blood thinners (anticoagulants) for 1 week before the surgery or as told by your health care provider. °· Do not eat or drink anything for 8 hours before the surgery or as told by your health care provider. °· Quit smoking if you smoke. °· Arrange for a ride home after surgery and for someone to help you at home during recovery. °What happens during the procedure? °· You will be given antibiotic medicine. °· An IV tube will be placed in your arm. You  will be given medicine to make you sleep (general anesthetic). °· A gas (carbon dioxide) will be used to inflate your abdomen. This will allow your surgeon to look inside your abdomen, perform your surgery, and treat any other problems found if necessary. °· Three or four small incisions (often less than 1/2 inch) will be made in your abdomen. One of these incisions will be made in the area of your belly button (navel). The laparoscope will be inserted into the incision. Your surgeon will look through the laparoscope while doing your procedure. °· Other surgical instruments will be inserted through the other incisions. °· Your uterus may be removed through your vagina or cut into small pieces and removed through the small incisions. °· Your incisions will be closed. °What happens after the procedure? °· The gas will be released from inside your abdomen. °· You will be taken to the recovery area where a nurse will watch and check your progress. Once you are awake, stable, and taking fluids well, without other problems, you will return to your room or be allowed to go home. °· There is usually minimal discomfort following the surgery because the incisions are so small. °· You will be given pain medicine while you are in the hospital and for when you go home. °This information is not intended to replace advice given to you by your health care provider. Make sure you discuss any questions you have with your health care provider. °Document Released: 03/25/2007 Document Revised: 11/03/2015 Document Reviewed: 12/16/2012 °Elsevier Interactive Patient Education © 2017 [employer] ° °

## 2017-08-30 ENCOUNTER — Encounter: Payer: Self-pay | Admitting: Obstetrics & Gynecology

## 2017-08-30 ENCOUNTER — Telehealth: Payer: Self-pay

## 2017-08-30 ENCOUNTER — Ambulatory Visit (INDEPENDENT_AMBULATORY_CARE_PROVIDER_SITE_OTHER): Payer: BC Managed Care – PPO | Admitting: Obstetrics & Gynecology

## 2017-08-30 VITALS — BP 130/80 | HR 69 | Ht 62.0 in | Wt 209.0 lb

## 2017-08-30 DIAGNOSIS — Z9889 Other specified postprocedural states: Secondary | ICD-10-CM | POA: Diagnosis not present

## 2017-08-30 DIAGNOSIS — N946 Dysmenorrhea, unspecified: Secondary | ICD-10-CM

## 2017-08-30 DIAGNOSIS — R102 Pelvic and perineal pain: Secondary | ICD-10-CM | POA: Diagnosis not present

## 2017-08-30 MED ORDER — HYDROCODONE-ACETAMINOPHEN 5-325 MG PO TABS: 1.0000 | ORAL_TABLET | ORAL | 0 refills | Status: DC | PRN

## 2017-08-30 NOTE — Telephone Encounter (Signed)
Pt aware and scheduled for 3:50 today.

## 2017-08-30 NOTE — Progress Notes (Signed)
History of Present Illness:  Cynthia Solis is a 42 y.o. who presents with continued lower pelvic pains, cyclic in nature. Since last visit, she states that her symptoms are worsening.  Mid pelvis, no radiation, severe at times, happens 5-7 days per month assoc w scant spotting, Heat is only modifier, nothing makes it worse.   PMHx: She  has a past medical history of Anxiety, Atrial flutter ([place]), BRCA gene mutation negative in female (06/22/2016), Depression, Dysrhythmia, Family history of breast cancer (08/2014), Family history of ovarian cancer (03/28/2015), Fracture of lumbar spine ([place]), GERD (gastroesophageal reflux disease), Heart murmur, History of abnormal mammogram (06/27/2014), History of chicken pox, History of kidney stones, History of Papanicolaou smear of cervix (04/20/2014), Mitral regurgitation, Obesity, and Pelvic pain. Also,  has a past surgical history that includes Laparoscopic salpingo oophorectomy (Left, 04/05/2015); Elbow surgery; Colonoscopy with propofol (N/A, 07/14/2015); Esophagogastroduodenoscopy (N/A, 07/14/2015); Laparoscopic unilateral salpingectomy (Right, 08/11/2015); Laparoscopic ovarian cystectomy (Right, 08/11/2015); Back surgery (2008); Diagnostic laparoscopy (2005); laparoscopy (N/A, 04/05/2015); Cholecystectomy (N/A, 04/19/2016); Endometrial ablation; Dilation and curettage of uterus (2000); Induced abortion; and Ablation., family history includes Breast cancer in her cousin; Breast cancer (age of onset: 22) in her maternal grandmother and mother; Breast cancer (age of onset: 67) in her maternal aunt; CVA in her maternal grandmother; Cancer in her paternal grandfather; Dementia in her maternal grandfather and maternal grandmother; Diabetes in her maternal grandfather and maternal grandmother; Heart attack in her paternal grandfather; Hyperlipidemia in her father; Hypertension in her father, maternal grandfather, maternal grandmother, and paternal grandfather; Ovarian cancer (age  of onset: 70) in her mother.,  reports that she has never smoked. She has never used smokeless tobacco. She reports that she drinks alcohol. She reports that she does not use drugs.  Current Outpatient Medications:  .  ALPRAZolam (XANAX) 0.5 MG tablet, TAKE 1/2 TO 1 TABLET BY MOUTH EVERY 12 HOURS AS NEEDED, Disp: , Rfl: 0 .  buPROPion (WELLBUTRIN XL) 300 MG 24 hr tablet, Take 300 mg by mouth every morning. , Disp: , Rfl:  .  metoprolol (LOPRESSOR) 50 MG tablet, Take 50 mg by mouth 2 (two) times daily., Disp: , Rfl:  .  pantoprazole (PROTONIX) 40 MG tablet, Take 40 mg by mouth 2 (two) times daily. , Disp: , Rfl:  .  triamcinolone cream (KENALOG) 0.1 %, Apply topically., Disp: , Rfl:  .  ALPRAZolam (XANAX) 0.25 MG tablet, Take 0.25 mg by mouth 3 (three) times daily as needed for anxiety., Disp: , Rfl:  .  dicyclomine (BENTYL) 10 MG capsule, Take 10 mg by mouth 4 (four) times daily -  before meals and at bedtime., Disp: , Rfl:  .  DULoxetine (CYMBALTA) 20 MG capsule, Take 40 mg by mouth every morning. , Disp: , Rfl:  .  gabapentin (NEURONTIN) 100 MG capsule, Take by mouth., Disp: , Rfl:  .  HYDROcodone-acetaminophen (NORCO) 5-325 MG tablet, Take 1-2 tablets by mouth every 4 (four) hours as needed for moderate pain. (Patient not taking: Reported on 04/19/2017), Disp: 30 tablet, Rfl: 0 .  meloxicam (MOBIC) 15 MG tablet, Take 1 tablet (15 mg total) 2 (two) times daily as needed by mouth for pain. (Patient not taking: Reported on 08/30/2017), Disp: 60 tablet, Rfl: 3 .  ondansetron (ZOFRAN ODT) 4 MG disintegrating tablet, Take 1 tablet (4 mg total) by mouth every 8 (eight) hours as needed for nausea or vomiting., Disp: 40 tablet, Rfl: 0 Also, is allergic to avelox [moxifloxacin hcl in nacl] and sulfa antibiotics.Marland Kitchen  Review of Systems  All other systems reviewed and are negative.  Physical Exam:  There were no vitals taken for this visit. There is no height or weight on file to calculate  BMI. Constitutional: Well nourished, well developed female in no acute distress.  Abdomen: diffusely non tender to palpation, non distended, and no masses, hernias Neuro: Grossly intact Psych:  Normal mood and affect.    Assessment:  Problem List Items Addressed This Visit      Genitourinary   Dysmenorrhea - Primary     Other   Status post endometrial ablation   Pelvic pain in female    Options discussed as before Prefers TLH, to schedule Hydrocodone for pain this weekend (short term discussed)  I have had a careful discussion with this patient about all the options available and the risk/benefits of each. I have fully informed this patient that a hysterectomy may subject her to a variety of discomforts and risks: She understands that most patients have surgery with little difficulty, but problems can happen ranging from minor to fatal. These include nausea, vomiting, pain, bleeding, infection, poor healing, hernia, wound separation, vaginal cuff separation or formation of adhesions. Unexpected reactions may occur from any drug or anesthetic given. Unintended injury may occur to other pelvic or abdominal structures such as Fallopian tubes, ovaries, bladder, ureter (tube from kidney to bladder), or bowel. Nerves going from the pelvis to the legs may be injured. Any such injury may require immediate or later additional surgery to correct the problem. Excessive blood loss requiring transfusion is very unlikely but possible. Dangerous blood clots may form in the legs or lungs. Physical and sexual activity will be restricted in varying degrees for an indeterminate period of time but most often 2-4 weeks. She understands that the plan is to do this laparoscopically, however, there is a chance that this will need to be performed via a larger incision. She may be hospitalized overnight. Finally, she understands that it is impossible to list every possible undesirable effect and that the condition for  which surgery is done is not always cured or significantly improved, and in rare cases may be even worse. I have also counseled her extensively about the pros and cons of ovarian conservation versus removal. Ample time was given to answer all questions.   Barnett Applebaum, MD, [facility], [facility] 08/30/2017  3:54 PM

## 2017-08-30 NOTE — Telephone Encounter (Signed)
I can see patient, last appt was NOV and we discussed surgery for pain and bleeding then.  Is she considering?  If she is , then we can see her and make arrangements for hysterectomy.

## 2017-08-30 NOTE — Telephone Encounter (Signed)
Pt states she is experiencing extreme lower abdominal pain that is getting worse and almost intolerable to the point she was thinking of going to the ER but doesn't think they will do anything. Please advise if you can see today. Thank you. Pt cb# [phone]

## 2017-08-30 NOTE — Patient Instructions (Signed)
April 16

## 2017-09-02 ENCOUNTER — Telehealth: Payer: Self-pay | Admitting: Obstetrics & Gynecology

## 2017-09-02 NOTE — Telephone Encounter (Signed)
Lmtrc

## 2017-09-02 NOTE — Telephone Encounter (Signed)
-----   Message from Nadara Mustardobert P Harris, MD sent at 08/30/2017  4:09 PM EDT ----- Regarding: surgery Surgery Booking Request Patient Full Name:  Cynthia Solis  MRN: [id number]  DOB: [birth year]  Surgeon: Letitia Libraobert Paul Harris, MD  Requested Surgery Date and Time: 09/24/17 Primary Diagnosis AND Code: Pelvic pain, Dysmenorrhea, Prior Endometrial Ablation Secondary Diagnosis and Code:  Surgical Procedure: TLH L&D Notification: No Admission Status: same day surgery Length of Surgery: 1 hr Special Case Needs: no H&P: yes (date) Phone Interview???: yes Interpreter: Language:  Medical Clearance: no Special Scheduling Instructions: no

## 2017-09-02 NOTE — Telephone Encounter (Signed)
[facility] @ 8am w/ Dr. Tiburcio PeaHarris, Pre-admit Testing phone interview to be scheduled, and OR on 09/24/17. Patient is aware [facility]. Ext given.

## 2017-09-17 ENCOUNTER — Ambulatory Visit (INDEPENDENT_AMBULATORY_CARE_PROVIDER_SITE_OTHER): Payer: BC Managed Care – PPO | Admitting: Obstetrics & Gynecology

## 2017-09-17 ENCOUNTER — Encounter: Payer: BC Managed Care – PPO | Admitting: Obstetrics & Gynecology

## 2017-09-17 ENCOUNTER — Encounter: Payer: Self-pay | Admitting: Obstetrics & Gynecology

## 2017-09-17 VITALS — BP 120/80 | HR 70 | Ht 62.0 in | Wt 205.0 lb

## 2017-09-17 DIAGNOSIS — Z9889 Other specified postprocedural states: Secondary | ICD-10-CM

## 2017-09-17 DIAGNOSIS — N946 Dysmenorrhea, unspecified: Secondary | ICD-10-CM | POA: Diagnosis not present

## 2017-09-17 DIAGNOSIS — R102 Pelvic and perineal pain unspecified side: Secondary | ICD-10-CM

## 2017-09-17 NOTE — Progress Notes (Signed)
PRE-OPERATIVE HISTORY AND PHYSICAL EXAM  HPI:  Cynthia Solis is a 42 y.o. [id number] No LMP recorded.; she is being admitted for surgery related to pelvic pain.  She continues lower pelvic pains, cyclic in nature. Since last visit, she states that her symptoms are worsening.  Mid pelvis, no radiation, severe at times, happens 5-7 days per month assoc w scant spotting, Heat is only modifier, nothing makes it worse.  Does not desire hormonal attempts at control.  PMHx: Past Medical History:  Diagnosis Date  . Anxiety   . Atrial flutter ([place])   . BRCA gene mutation negative in female 06/22/2016   3/16 pt MyRisk neg; sister is RAD51C pos; mom is BRCA neg.  . Depression   . Dysrhythmia    H/O ATRIAL FLUTTER-PT STATES METOPROLOL DOSE HAS BEEN INCREASED AND PALPITATIONS ARE NOW UNDER CONTROL  . Family history of breast cancer 08/2014   IBIS = 18%  . Family history of ovarian cancer 03/28/2015   PT'S SISTER IS RAD51C POS, PT IS NEG.  . Fracture of lumbar spine ([place])   . GERD (gastroesophageal reflux disease)   . Heart murmur    since a child  . History of abnormal mammogram 06/27/2014   NEG  . History of chicken pox   . History of kidney stones   . History of Papanicolaou smear of cervix 04/20/2014   NEG CT NEG  . Mitral regurgitation   . Obesity   . Pelvic pain    Past Surgical History:  Procedure Laterality Date  . ABLATION    . BACK SURGERY  2008   lumbar 4-5 fusion, titanium  . CHOLECYSTECTOMY N/A 04/19/2016  . COLONOSCOPY WITH PROPOFOL N/A 07/14/2015   Procedure: COLONOSCOPY WITH PROPOFOL;  Surgeon: Hulen Luster, MD;  Location[facility] ENDOSCOPY;  Service: Gastroenterology;  Laterality: N/A;  . DIAGNOSTIC LAPAROSCOPY  2005  . DILATION AND CURETTAGE OF UTERUS  2000  . ELBOW SURGERY     right elbow tendon repair  . ENDOMETRIAL ABLATION    . ESOPHAGOGASTRODUODENOSCOPY N/A 07/14/2015   Procedure: ESOPHAGOGASTRODUODENOSCOPY (EGD);  Surgeon: Hulen Luster, MD;  Location: [facility] ENDOSCOPY;   Service: Gastroenterology;  Laterality: N/A;  . INDUCED ABORTION     X2  . LAPAROSCOPIC OVARIAN CYSTECTOMY Right 08/11/2015   Procedure: LAPAROSCOPIC OVARIAN CYSTECTOMY;  Surgeon: Gae Dry, MD;  Location: [facility] ORS;  Service: Gynecology;  Laterality: Right;  . LAPAROSCOPIC SALPINGO OOPHERECTOMY Left 04/05/2015   Procedure: LAPAROSCOPIC SALPINGO OOPHORECTOMY;  Surgeon: Gae Dry, MD;  Location: [facility] ORS;  Service: Gynecology;  Laterality: Left;  . LAPAROSCOPIC UNILATERAL SALPINGECTOMY Right 08/11/2015   Procedure: LAPAROSCOPIC UNILATERAL SALPINGECTOMY;  Surgeon: Gae Dry, MD;  Location: [facility] ORS;  Service: Gynecology;  Laterality: Right;  . LAPAROSCOPY N/A 04/05/2015   Procedure: LAPAROSCOPY OPERATIVE;  Surgeon: Gae Dry, MD;  Location: [facility] ORS;  Service: Gynecology;  Laterality: N/A;   Family History  Problem Relation Age of Onset  . Ovarian cancer Mother 68       NEG for BRACA  . Breast cancer Mother 23       49 dx 2nd time pt is NEG for BRACA  . Breast cancer Maternal Grandmother 33       53 dx twice   . CVA Maternal Grandmother   . Dementia Maternal Grandmother   . Diabetes Maternal Grandmother   . Hypertension Maternal Grandmother   . Hyperlipidemia Father   . Hypertension Father   . Breast  cancer Maternal Aunt 70       BRCA NEG; SECOND TIME AGE 53  . Dementia Maternal Grandfather   . Diabetes Maternal Grandfather   . Hypertension Maternal Grandfather   . Hypertension Paternal Grandfather   . Heart attack Paternal Grandfather   . Cancer Paternal Grandfather   . Breast cancer Cousin        BRCA POSITIVE    Social History   Tobacco Use  . Smoking status: Never Smoker  . Smokeless tobacco: Never Used  Substance Use Topics  . Alcohol use: Yes    Comment: OCC  . Drug use: No    Current Outpatient Medications:  .  ALPRAZolam (XANAX) 0.5 MG tablet, TAKE 0.25 MG BY MOUTH IN THE MORNING AND 0.5 MG BY MOUTH IN THE EVENING AS NEEDED for anxiety, Disp:  , Rfl: 0 .  buPROPion (WELLBUTRIN XL) 300 MG 24 hr tablet, Take 300 mg by mouth every morning. , Disp: , Rfl:  .  doxepin (SINEQUAN) 10 MG capsule, Take 10 mg by mouth at bedtime as needed for sleep., Disp: , Rfl: 5 .  DULoxetine (CYMBALTA) 30 MG capsule, Take 30 mg by mouth daily., Disp: , Rfl: 1 .  DULoxetine (CYMBALTA) 60 MG capsule, Take 60 mg by mouth daily., Disp: , Rfl: 2 .  HYDROcodone-acetaminophen (NORCO) 5-325 MG tablet, Take 1-2 tablets by mouth every 4 (four) hours as needed for moderate pain., Disp: 20 tablet, Rfl: 0 .  ibuprofen (ADVIL,MOTRIN) 200 MG tablet, Take 800 mg by mouth every 6 (six) hours as needed for moderate pain., Disp: , Rfl:  .  meclizine (ANTIVERT) 25 MG tablet, Take 25 mg by mouth 3 (three) times daily as needed for dizziness., Disp: , Rfl: 0 .  meloxicam (MOBIC) 15 MG tablet, Take 1 tablet (15 mg total) 2 (two) times daily as needed by mouth for pain. (Patient not taking: Reported on 08/30/2017), Disp: 60 tablet, Rfl: 3 .  metoprolol (LOPRESSOR) 50 MG tablet, Take 50 mg by mouth 2 (two) times daily., Disp: , Rfl:  .  ondansetron (ZOFRAN ODT) 4 MG disintegrating tablet, Take 1 tablet (4 mg total) by mouth every 8 (eight) hours as needed for nausea or vomiting. (Patient not taking: Reported on 09/16/2017), Disp: 40 tablet, Rfl: 0 .  ondansetron (ZOFRAN-ODT) 8 MG disintegrating tablet, Take 8 mg by mouth every 8 (eight) hours as needed for nausea. for nausea, Disp: , Rfl: 0 .  pantoprazole (PROTONIX) 40 MG tablet, Take 40 mg by mouth 2 (two) times daily. , Disp: , Rfl:  .  triamcinolone cream (KENALOG) 0.1 %, Apply 1 application topically daily as needed (rash). , Disp: , Rfl:  Allergies: Avelox [moxifloxacin hcl in nacl] and Sulfa antibiotics  Review of Systems  Constitutional: Negative for chills, fever and malaise/fatigue.  HENT: Negative for congestion, sinus pain and sore throat.   Eyes: Negative for blurred vision and pain.  Respiratory: Negative for cough and  wheezing.   Cardiovascular: Negative for chest pain and leg swelling.  Gastrointestinal: Negative for abdominal pain, constipation, diarrhea, heartburn, nausea and vomiting.  Genitourinary: Negative for dysuria, frequency, hematuria and urgency.  Musculoskeletal: Negative for back pain, joint pain, myalgias and neck pain.  Skin: Negative for itching and rash.  Neurological: Negative for dizziness, tremors and weakness.  Endo/Heme/Allergies: Does not bruise/bleed easily.  Psychiatric/Behavioral: Negative for depression. The patient is not nervous/anxious and does not have insomnia.    Objective: BP 120/80   Pulse 70   Ht 5'  2" (1.575 m)   Wt 205 lb (93 kg)   BMI 37.49 kg/m   Filed Weights   09/17/17 1552  Weight: 205 lb (93 kg)   Physical Exam  Constitutional: She is oriented to person, place, and time. She appears well-developed and well-nourished. No distress.  HENT:  Head: Normocephalic and atraumatic. Head is without laceration.  Right Ear: Hearing normal.  Left Ear: Hearing normal.  Nose: No epistaxis.  No foreign bodies.  Mouth/Throat: Uvula is midline, oropharynx is clear and moist and mucous membranes are normal.  Eyes: Pupils are equal, round, and reactive to light.  Neck: Normal range of motion. Neck supple. No thyromegaly present.  Cardiovascular: Normal rate and regular rhythm. Exam reveals no gallop and no friction rub.  No murmur heard. Pulmonary/Chest: Effort normal and breath sounds normal. No respiratory distress. She has no wheezes. Right breast exhibits no mass, no skin change and no tenderness. Left breast exhibits no mass, no skin change and no tenderness.  Abdominal: Soft. Bowel sounds are normal. She exhibits no distension. There is no tenderness. There is no rebound.  Musculoskeletal: Normal range of motion.  Neurological: She is alert and oriented to person, place, and time. No cranial nerve deficit.  Skin: Skin is warm and dry.  Psychiatric: She has a  normal mood and affect. Judgment normal.  Vitals reviewed.  Assessment: 1. Dysmenorrhea   2. Pelvic pain in female   3. Status post endometrial ablation   Prefers to keep ovary.  Has family history of ovarian cancer but also is known BRCA neg.  I have had a careful discussion with this patient about all the options available and the risk/benefits of each. I have fully informed this patient that surgery may subject her to a variety of discomforts and risks: She understands that most patients have surgery with little difficulty, but problems can happen ranging from minor to fatal. These include nausea, vomiting, pain, bleeding, infection, poor healing, hernia, or formation of adhesions. Unexpected reactions may occur from any drug or anesthetic given. Unintended injury may occur to other pelvic or abdominal structures such as Fallopian tubes, ovaries, bladder, ureter (tube from kidney to bladder), or bowel. Nerves going from the pelvis to the legs may be injured. Any such injury may require immediate or later additional surgery to correct the problem. Excessive blood loss requiring transfusion is very unlikely but possible. Dangerous blood clots may form in the legs or lungs. Physical and sexual activity will be restricted in varying degrees for an indeterminate period of time but most often 2-6 weeks.  Finally, she understands that it is impossible to list every possible undesirable effect and that the condition for which surgery is done is not always cured or significantly improved, and in rare cases may be even worse.Ample time was given to answer all questions.  Barnett Applebaum, MD, [facility], [facility] 09/17/2017  4:13 PM

## 2017-09-17 NOTE — H&P (View-Only) (Signed)
PRE-OPERATIVE HISTORY AND PHYSICAL EXAM  HPI:  Cynthia Solis is a 42 y.o. [id number] No LMP recorded.; she is being admitted for surgery related to pelvic pain.  She continues lower pelvic pains, cyclic in nature. Since last visit, she states that her symptoms are worsening.  Mid pelvis, no radiation, severe at times, happens 5-7 days per month assoc w scant spotting, Heat is only modifier, nothing makes it worse.  Does not desire hormonal attempts at control.  PMHx: Past Medical History:  Diagnosis Date  . Anxiety   . Atrial flutter ([place])   . BRCA gene mutation negative in female 06/22/2016   3/16 pt MyRisk neg; sister is RAD51C pos; mom is BRCA neg.  . Depression   . Dysrhythmia    H/O ATRIAL FLUTTER-PT STATES METOPROLOL DOSE HAS BEEN INCREASED AND PALPITATIONS ARE NOW UNDER CONTROL  . Family history of breast cancer 08/2014   IBIS = 18%  . Family history of ovarian cancer 03/28/2015   PT'S SISTER IS RAD51C POS, PT IS NEG.  . Fracture of lumbar spine ([place])   . GERD (gastroesophageal reflux disease)   . Heart murmur    since a child  . History of abnormal mammogram 06/27/2014   NEG  . History of chicken pox   . History of kidney stones   . History of Papanicolaou smear of cervix 04/20/2014   NEG CT NEG  . Mitral regurgitation   . Obesity   . Pelvic pain    Past Surgical History:  Procedure Laterality Date  . ABLATION    . BACK SURGERY  2008   lumbar 4-5 fusion, titanium  . CHOLECYSTECTOMY N/A 04/19/2016  . COLONOSCOPY WITH PROPOFOL N/A 07/14/2015   Procedure: COLONOSCOPY WITH PROPOFOL;  Surgeon: Hulen Luster, MD;  Location: [facility] ENDOSCOPY;  Service: Gastroenterology;  Laterality: N/A;  . DIAGNOSTIC LAPAROSCOPY  2005  . DILATION AND CURETTAGE OF UTERUS  2000  . ELBOW SURGERY     right elbow tendon repair  . ENDOMETRIAL ABLATION    . ESOPHAGOGASTRODUODENOSCOPY N/A 07/14/2015   Procedure: ESOPHAGOGASTRODUODENOSCOPY (EGD);  Surgeon: Hulen Luster, MD;  Location: [facility] ENDOSCOPY;   Service: Gastroenterology;  Laterality: N/A;  . INDUCED ABORTION     X2  . LAPAROSCOPIC OVARIAN CYSTECTOMY Right 08/11/2015   Procedure: LAPAROSCOPIC OVARIAN CYSTECTOMY;  Surgeon: Gae Dry, MD;  Location: [facility] ORS;  Service: Gynecology;  Laterality: Right;  . LAPAROSCOPIC SALPINGO OOPHERECTOMY Left 04/05/2015   Procedure: LAPAROSCOPIC SALPINGO OOPHORECTOMY;  Surgeon: Gae Dry, MD;  Location: [facility] ORS;  Service: Gynecology;  Laterality: Left;  . LAPAROSCOPIC UNILATERAL SALPINGECTOMY Right 08/11/2015   Procedure: LAPAROSCOPIC UNILATERAL SALPINGECTOMY;  Surgeon: Gae Dry, MD;  Location: [facility] ORS;  Service: Gynecology;  Laterality: Right;  . LAPAROSCOPY N/A 04/05/2015   Procedure: LAPAROSCOPY OPERATIVE;  Surgeon: Gae Dry, MD;  Location: [facility] ORS;  Service: Gynecology;  Laterality: N/A;   Family History  Problem Relation Age of Onset  . Ovarian cancer Mother 68       NEG for BRACA  . Breast cancer Mother 23       49 dx 2nd time pt is NEG for BRACA  . Breast cancer Maternal Grandmother 33       53 dx twice   . CVA Maternal Grandmother   . Dementia Maternal Grandmother   . Diabetes Maternal Grandmother   . Hypertension Maternal Grandmother   . Hyperlipidemia Father   . Hypertension Father   . Breast  cancer Maternal Aunt 70       BRCA NEG; SECOND TIME AGE 53  . Dementia Maternal Grandfather   . Diabetes Maternal Grandfather   . Hypertension Maternal Grandfather   . Hypertension Paternal Grandfather   . Heart attack Paternal Grandfather   . Cancer Paternal Grandfather   . Breast cancer Cousin        BRCA POSITIVE    Social History   Tobacco Use  . Smoking status: Never Smoker  . Smokeless tobacco: Never Used  Substance Use Topics  . Alcohol use: Yes    Comment: OCC  . Drug use: No    Current Outpatient Medications:  .  ALPRAZolam (XANAX) 0.5 MG tablet, TAKE 0.25 MG BY MOUTH IN THE MORNING AND 0.5 MG BY MOUTH IN THE EVENING AS NEEDED for anxiety, Disp:  , Rfl: 0 .  buPROPion (WELLBUTRIN XL) 300 MG 24 hr tablet, Take 300 mg by mouth every morning. , Disp: , Rfl:  .  doxepin (SINEQUAN) 10 MG capsule, Take 10 mg by mouth at bedtime as needed for sleep., Disp: , Rfl: 5 .  DULoxetine (CYMBALTA) 30 MG capsule, Take 30 mg by mouth daily., Disp: , Rfl: 1 .  DULoxetine (CYMBALTA) 60 MG capsule, Take 60 mg by mouth daily., Disp: , Rfl: 2 .  HYDROcodone-acetaminophen (NORCO) 5-325 MG tablet, Take 1-2 tablets by mouth every 4 (four) hours as needed for moderate pain., Disp: 20 tablet, Rfl: 0 .  ibuprofen (ADVIL,MOTRIN) 200 MG tablet, Take 800 mg by mouth every 6 (six) hours as needed for moderate pain., Disp: , Rfl:  .  meclizine (ANTIVERT) 25 MG tablet, Take 25 mg by mouth 3 (three) times daily as needed for dizziness., Disp: , Rfl: 0 .  meloxicam (MOBIC) 15 MG tablet, Take 1 tablet (15 mg total) 2 (two) times daily as needed by mouth for pain. (Patient not taking: Reported on 08/30/2017), Disp: 60 tablet, Rfl: 3 .  metoprolol (LOPRESSOR) 50 MG tablet, Take 50 mg by mouth 2 (two) times daily., Disp: , Rfl:  .  ondansetron (ZOFRAN ODT) 4 MG disintegrating tablet, Take 1 tablet (4 mg total) by mouth every 8 (eight) hours as needed for nausea or vomiting. (Patient not taking: Reported on 09/16/2017), Disp: 40 tablet, Rfl: 0 .  ondansetron (ZOFRAN-ODT) 8 MG disintegrating tablet, Take 8 mg by mouth every 8 (eight) hours as needed for nausea. for nausea, Disp: , Rfl: 0 .  pantoprazole (PROTONIX) 40 MG tablet, Take 40 mg by mouth 2 (two) times daily. , Disp: , Rfl:  .  triamcinolone cream (KENALOG) 0.1 %, Apply 1 application topically daily as needed (rash). , Disp: , Rfl:  Allergies: Avelox [moxifloxacin hcl in nacl] and Sulfa antibiotics  Review of Systems  Constitutional: Negative for chills, fever and malaise/fatigue.  HENT: Negative for congestion, sinus pain and sore throat.   Eyes: Negative for blurred vision and pain.  Respiratory: Negative for cough and  wheezing.   Cardiovascular: Negative for chest pain and leg swelling.  Gastrointestinal: Negative for abdominal pain, constipation, diarrhea, heartburn, nausea and vomiting.  Genitourinary: Negative for dysuria, frequency, hematuria and urgency.  Musculoskeletal: Negative for back pain, joint pain, myalgias and neck pain.  Skin: Negative for itching and rash.  Neurological: Negative for dizziness, tremors and weakness.  Endo/Heme/Allergies: Does not bruise/bleed easily.  Psychiatric/Behavioral: Negative for depression. The patient is not nervous/anxious and does not have insomnia.    Objective: BP 120/80   Pulse 70   Ht 5'  2" (1.575 m)   Wt 205 lb (93 kg)   BMI 37.49 kg/m   Filed Weights   09/17/17 1552  Weight: 205 lb (93 kg)   Physical Exam  Constitutional: She is oriented to person, place, and time. She appears well-developed and well-nourished. No distress.  HENT:  Head: Normocephalic and atraumatic. Head is without laceration.  Right Ear: Hearing normal.  Left Ear: Hearing normal.  Nose: No epistaxis.  No foreign bodies.  Mouth/Throat: Uvula is midline, oropharynx is clear and moist and mucous membranes are normal.  Eyes: Pupils are equal, round, and reactive to light.  Neck: Normal range of motion. Neck supple. No thyromegaly present.  Cardiovascular: Normal rate and regular rhythm. Exam reveals no gallop and no friction rub.  No murmur heard. Pulmonary/Chest: Effort normal and breath sounds normal. No respiratory distress. She has no wheezes. Right breast exhibits no mass, no skin change and no tenderness. Left breast exhibits no mass, no skin change and no tenderness.  Abdominal: Soft. Bowel sounds are normal. She exhibits no distension. There is no tenderness. There is no rebound.  Musculoskeletal: Normal range of motion.  Neurological: She is alert and oriented to person, place, and time. No cranial nerve deficit.  Skin: Skin is warm and dry.  Psychiatric: She has a  normal mood and affect. Judgment normal.  Vitals reviewed.  Assessment: 1. Dysmenorrhea   2. Pelvic pain in female   3. Status post endometrial ablation   Prefers to keep ovary.  Has family history of ovarian cancer but also is known BRCA neg.  I have had a careful discussion with this patient about all the options available and the risk/benefits of each. I have fully informed this patient that surgery may subject her to a variety of discomforts and risks: She understands that most patients have surgery with little difficulty, but problems can happen ranging from minor to fatal. These include nausea, vomiting, pain, bleeding, infection, poor healing, hernia, or formation of adhesions. Unexpected reactions may occur from any drug or anesthetic given. Unintended injury may occur to other pelvic or abdominal structures such as Fallopian tubes, ovaries, bladder, ureter (tube from kidney to bladder), or bowel. Nerves going from the pelvis to the legs may be injured. Any such injury may require immediate or later additional surgery to correct the problem. Excessive blood loss requiring transfusion is very unlikely but possible. Dangerous blood clots may form in the legs or lungs. Physical and sexual activity will be restricted in varying degrees for an indeterminate period of time but most often 2-6 weeks.  Finally, she understands that it is impossible to list every possible undesirable effect and that the condition for which surgery is done is not always cured or significantly improved, and in rare cases may be even worse.Ample time was given to answer all questions.  Barnett Applebaum, MD, [facility], [facility] 09/17/2017  4:13 PM

## 2017-09-18 ENCOUNTER — Other Ambulatory Visit: Payer: Self-pay

## 2017-09-18 ENCOUNTER — Encounter
Admission: RE | Admit: 2017-09-18 | Discharge: 2017-09-18 | Disposition: A | Discharge status: Home / Self Care | Payer: BC Managed Care – PPO | Source: Ambulatory Visit | Attending: Obstetrics & Gynecology | Admitting: Obstetrics & Gynecology

## 2017-09-18 NOTE — Patient Instructions (Signed)
Your procedure is scheduled on: 09-24-17 TUESDAY Report to Same Day Surgery 2nd floor medical mall [facility] Entrance-take elevator on left to 2nd floor.  Check in with surgery information desk.) To find out your arrival time please call [phone] between 1PM - 3PM on 09-23-17 MONDAY  Remember: Instructions that are not followed completely may result in serious medical risk, up to and including death, or upon the discretion of your surgeon and anesthesiologist your surgery may need to be rescheduled.    _x___ 1. Do not eat food after midnight the night before your procedure. NO GUM OR CANDY AFTER MIDNIGHT.  You may drink clear liquids up to 2 hours before you are scheduled to arrive at the hospital for your procedure.  Do not drink clear liquids within 2 hours of your scheduled arrival to the hospital.  Clear liquids include  --Water or Apple juice without pulp  --Clear carbohydrate beverage such as ClearFast or Gatorade  --Black Coffee or Clear Tea (No milk, no creamers, do not add anything to the coffee or Tea    __x__ 2. No Alcohol for 24 hours before or after surgery.   __x__3. No Smoking or e-cigarettes for 24 prior to surgery.  Do not use any chewable tobacco products for at least 6 hour prior to surgery   ____  4. Bring all medications with you on the day of surgery if instructed.    __x__ 5. Notify your doctor if there is any change in your medical condition     (cold, fever, infections).    x___6. On the morning of surgery brush your teeth with toothpaste and water.  You may rinse your mouth with mouth wash if you wish.  Do not swallow any toothpaste or mouthwash.   Do not wear jewelry, make-up, hairpins, clips or nail polish.  Do not wear lotions, powders, or perfumes. You may wear deodorant.  Do not shave 48 hours prior to surgery. Men may shave face and neck.  Do not bring valuables to the hospital.    [facility] is not responsible for any belongings or  valuables.               Contacts, dentures or bridgework may not be worn into surgery.  Leave your suitcase in the car. After surgery it may be brought to your room.  For patients admitted to the hospital, discharge time is determined by your treatment team.  _  Patients discharged the day of surgery will not be allowed to drive home.  You will need someone to drive you home and stay with you the night of your procedure.    Please read over the following fact sheets that you were given:   [facility] Preparing for Surgery and or MRSA Information   _x___ TAKE THE FOLLOWING MEDICATION THE MORNING OF SURGERY. These include:  1. WELLBUTRIN  2. METOPROLOL  3. PROTONIX  4. YOU MAY TAKE YOUR XANAX DAY OF SURGERY IF NEEDED  5. YOU MAY TAKE YOUR HYDROCODONE DAY OF SURGERY IF NEEDED  6.  ____Fleets enema or Magnesium Citrate as directed.   _x___ Use CHG Soap or sage wipes as directed on instruction sheet   ____ Use inhalers on the day of surgery and bring to hospital day of surgery  ____ Stop Metformin and Janumet 2 days prior to surgery.    ____ Take 1/2 of usual insulin dose the night before surgery and none on the morning surgery.   ____ Follow recommendations  from Cardiologist, Pulmonologist or PCP regarding stopping Aspirin, Coumadin, Plavix ,Eliquis, Effient, or Pradaxa, and Pletal.  X____Stop Anti-inflammatories such as Advil, Aleve, Ibuprofen, Motrin, Naproxen, Naprosyn, Goodies powders or aspirin products NOW-OK to take Tylenol  OR HYDROCODONE IF NEEDED   ____ Stop supplements until after surgery.    ____ Bring C-Pap to the hospital.

## 2017-09-18 NOTE — Pre-Procedure Instructions (Signed)
ECG 12 lead (Adult)06/28/2017 [facility] Name Value Ref Range  EKG Systolic BP  mmHg  EKG Diastolic BP  mmHg  EKG Ventricular Rate 62 BPM  EKG Atrial Rate 62 BPM  EKG P-R Interval 180 ms  EKG QRS Duration 80 ms  EKG Q-T Interval 404 ms  EKG QTC Calculation 410 ms  EKG Calculated P Axis 35 degrees  EKG Calculated R Axis 7 degrees  EKG Calculated T Axis 14 degrees  Result Narrative  NORMAL SINUS RHYTHM POOR R WAVE PROGRESSION IN ANTERIOR PRECORDIAL LEADS NON-SPECIFIC ST/T WAVE CHANGES ABNORMAL ECG WHEN COMPARED WITH ECG OF 28-Jun-2017 10:45, NO SIGNIFICANT CHANGE WAS FOUND Confirmed by Christella NoaMiller, Paula ([id number]) on 06/28/2017 5:10:22 PM  Other Result Information  Interface, Rad Results In - 06/28/2017  5:10 PM EST NORMAL SINUS RHYTHM POOR R WAVE PROGRESSION IN ANTERIOR PRECORDIAL LEADS NON-SPECIFIC ST/T WAVE CHANGES ABNORMAL ECG WHEN COMPARED WITH ECG OF 28-Jun-2017 10:45, NO SIGNIFICANT CHANGE WAS FOUND Confirmed by Christella NoaMiller, Paula ([id number]) on 06/28/2017 5:10:22 PM  Status Results Details   Encounter Summary

## 2017-09-23 ENCOUNTER — Encounter
Admission: RE | Admit: 2017-09-23 | Discharge: 2017-09-23 | Disposition: A | Discharge status: Home / Self Care | Payer: BC Managed Care – PPO | Source: Ambulatory Visit | Attending: Obstetrics & Gynecology | Admitting: Obstetrics & Gynecology

## 2017-09-23 DIAGNOSIS — R102 Pelvic and perineal pain: Secondary | ICD-10-CM | POA: Diagnosis not present

## 2017-09-23 DIAGNOSIS — Z881 Allergy status to other antibiotic agents status: Secondary | ICD-10-CM | POA: Diagnosis not present

## 2017-09-23 DIAGNOSIS — Z6837 Body mass index (BMI) 37.0-37.9, adult: Secondary | ICD-10-CM | POA: Diagnosis not present

## 2017-09-23 DIAGNOSIS — Z803 Family history of malignant neoplasm of breast: Secondary | ICD-10-CM | POA: Diagnosis not present

## 2017-09-23 DIAGNOSIS — Z79899 Other long term (current) drug therapy: Secondary | ICD-10-CM | POA: Diagnosis not present

## 2017-09-23 DIAGNOSIS — N802 Endometriosis of fallopian tube: Secondary | ICD-10-CM | POA: Diagnosis not present

## 2017-09-23 DIAGNOSIS — Z820 Family history of epilepsy and other diseases of the nervous system: Secondary | ICD-10-CM | POA: Diagnosis not present

## 2017-09-23 DIAGNOSIS — Z809 Family history of malignant neoplasm, unspecified: Secondary | ICD-10-CM | POA: Diagnosis not present

## 2017-09-23 DIAGNOSIS — Z8041 Family history of malignant neoplasm of ovary: Secondary | ICD-10-CM | POA: Diagnosis not present

## 2017-09-23 DIAGNOSIS — K219 Gastro-esophageal reflux disease without esophagitis: Secondary | ICD-10-CM | POA: Diagnosis not present

## 2017-09-23 DIAGNOSIS — N946 Dysmenorrhea, unspecified: Secondary | ICD-10-CM | POA: Diagnosis present

## 2017-09-23 DIAGNOSIS — Z87442 Personal history of urinary calculi: Secondary | ICD-10-CM | POA: Diagnosis not present

## 2017-09-23 DIAGNOSIS — F329 Major depressive disorder, single episode, unspecified: Secondary | ICD-10-CM | POA: Diagnosis not present

## 2017-09-23 DIAGNOSIS — Z882 Allergy status to sulfonamides status: Secondary | ICD-10-CM | POA: Diagnosis not present

## 2017-09-23 DIAGNOSIS — F419 Anxiety disorder, unspecified: Secondary | ICD-10-CM | POA: Diagnosis not present

## 2017-09-23 DIAGNOSIS — Z833 Family history of diabetes mellitus: Secondary | ICD-10-CM | POA: Diagnosis not present

## 2017-09-23 DIAGNOSIS — I340 Nonrheumatic mitral (valve) insufficiency: Secondary | ICD-10-CM | POA: Diagnosis not present

## 2017-09-23 DIAGNOSIS — E669 Obesity, unspecified: Secondary | ICD-10-CM | POA: Diagnosis not present

## 2017-09-23 DIAGNOSIS — Z90722 Acquired absence of ovaries, bilateral: Secondary | ICD-10-CM | POA: Diagnosis not present

## 2017-09-23 DIAGNOSIS — I4892 Unspecified atrial flutter: Secondary | ICD-10-CM | POA: Diagnosis not present

## 2017-09-23 DIAGNOSIS — Z823 Family history of stroke: Secondary | ICD-10-CM | POA: Diagnosis not present

## 2017-09-23 DIAGNOSIS — Z9049 Acquired absence of other specified parts of digestive tract: Secondary | ICD-10-CM | POA: Diagnosis not present

## 2017-09-23 LAB — CBC
HEMATOCRIT: 39.9 % (ref 35.0–47.0)
HEMOGLOBIN: 13.7 g/dL (ref 12.0–16.0)
MCH: 31.5 pg (ref 26.0–34.0)
MCHC: 34.3 g/dL (ref 32.0–36.0)
MCV: 91.9 fL (ref 80.0–100.0)
Platelets: 321 10*3/uL (ref 150–440)
RBC: 4.34 MIL/uL (ref 3.80–5.20)
RDW: 14 % (ref 11.5–14.5)
WBC: 8.3 10*3/uL (ref 3.6–11.0)

## 2017-09-23 LAB — TYPE AND SCREEN
ABO/RH(D): O POS
ANTIBODY SCREEN: NEGATIVE

## 2017-09-24 ENCOUNTER — Ambulatory Visit: Payer: BC Managed Care – PPO | Admitting: Certified Registered"

## 2017-09-24 ENCOUNTER — Other Ambulatory Visit: Payer: Self-pay

## 2017-09-24 ENCOUNTER — Encounter
Admission: RE | Disposition: A | Discharge status: Home / Self Care | Payer: Self-pay | Source: Ambulatory Visit | Attending: Obstetrics & Gynecology

## 2017-09-24 ENCOUNTER — Ambulatory Visit
Admission: RE | Admit: 2017-09-24 | Discharge: 2017-09-24 | Disposition: A | Discharge status: Home / Self Care | Payer: BC Managed Care – PPO | Source: Ambulatory Visit | Attending: Obstetrics & Gynecology | Admitting: Obstetrics & Gynecology

## 2017-09-24 ENCOUNTER — Encounter: Payer: Self-pay | Admitting: Certified Registered"

## 2017-09-24 DIAGNOSIS — Z809 Family history of malignant neoplasm, unspecified: Secondary | ICD-10-CM | POA: Insufficient documentation

## 2017-09-24 DIAGNOSIS — Z79899 Other long term (current) drug therapy: Secondary | ICD-10-CM | POA: Insufficient documentation

## 2017-09-24 DIAGNOSIS — F419 Anxiety disorder, unspecified: Secondary | ICD-10-CM | POA: Insufficient documentation

## 2017-09-24 DIAGNOSIS — I4892 Unspecified atrial flutter: Secondary | ICD-10-CM | POA: Insufficient documentation

## 2017-09-24 DIAGNOSIS — R102 Pelvic and perineal pain: Secondary | ICD-10-CM | POA: Insufficient documentation

## 2017-09-24 DIAGNOSIS — N946 Dysmenorrhea, unspecified: Secondary | ICD-10-CM | POA: Insufficient documentation

## 2017-09-24 DIAGNOSIS — Z823 Family history of stroke: Secondary | ICD-10-CM | POA: Insufficient documentation

## 2017-09-24 DIAGNOSIS — Z90722 Acquired absence of ovaries, bilateral: Secondary | ICD-10-CM | POA: Insufficient documentation

## 2017-09-24 DIAGNOSIS — Z87442 Personal history of urinary calculi: Secondary | ICD-10-CM | POA: Insufficient documentation

## 2017-09-24 DIAGNOSIS — K219 Gastro-esophageal reflux disease without esophagitis: Secondary | ICD-10-CM | POA: Insufficient documentation

## 2017-09-24 DIAGNOSIS — Z9049 Acquired absence of other specified parts of digestive tract: Secondary | ICD-10-CM | POA: Insufficient documentation

## 2017-09-24 DIAGNOSIS — Z820 Family history of epilepsy and other diseases of the nervous system: Secondary | ICD-10-CM | POA: Insufficient documentation

## 2017-09-24 DIAGNOSIS — E669 Obesity, unspecified: Secondary | ICD-10-CM | POA: Insufficient documentation

## 2017-09-24 DIAGNOSIS — F329 Major depressive disorder, single episode, unspecified: Secondary | ICD-10-CM | POA: Insufficient documentation

## 2017-09-24 DIAGNOSIS — I340 Nonrheumatic mitral (valve) insufficiency: Secondary | ICD-10-CM | POA: Insufficient documentation

## 2017-09-24 DIAGNOSIS — Z803 Family history of malignant neoplasm of breast: Secondary | ICD-10-CM | POA: Insufficient documentation

## 2017-09-24 DIAGNOSIS — Z6837 Body mass index (BMI) 37.0-37.9, adult: Secondary | ICD-10-CM | POA: Insufficient documentation

## 2017-09-24 DIAGNOSIS — N802 Endometriosis of fallopian tube: Secondary | ICD-10-CM | POA: Insufficient documentation

## 2017-09-24 DIAGNOSIS — Z882 Allergy status to sulfonamides status: Secondary | ICD-10-CM | POA: Insufficient documentation

## 2017-09-24 DIAGNOSIS — Z833 Family history of diabetes mellitus: Secondary | ICD-10-CM | POA: Insufficient documentation

## 2017-09-24 DIAGNOSIS — Z8041 Family history of malignant neoplasm of ovary: Secondary | ICD-10-CM | POA: Insufficient documentation

## 2017-09-24 DIAGNOSIS — Z881 Allergy status to other antibiotic agents status: Secondary | ICD-10-CM | POA: Insufficient documentation

## 2017-09-24 HISTORY — PX: LAPAROSCOPIC HYSTERECTOMY: SHX1926

## 2017-09-24 LAB — POCT PREGNANCY, URINE
Preg Test, Ur: NEGATIVE
Preg Test, Ur: NEGATIVE

## 2017-09-24 SURGERY — HYSTERECTOMY, TOTAL, LAPAROSCOPIC
Anesthesia: General

## 2017-09-24 MED ORDER — MORPHINE SULFATE (PF) 4 MG/ML IV SOLN: 1.0000 mg | INTRAVENOUS | Status: DC | PRN

## 2017-09-24 MED ORDER — OXYCODONE-ACETAMINOPHEN 5-325 MG PO TABS
1.0000 | ORAL_TABLET | ORAL | Status: DC | PRN
  Administered 2017-09-24: 1 via ORAL

## 2017-09-24 MED ORDER — LIDOCAINE HCL (PF) 2 % IJ SOLN
INTRAMUSCULAR | Status: AC
  Filled 2017-09-24: qty 10

## 2017-09-24 MED ORDER — ACETAMINOPHEN 10 MG/ML IV SOLN
INTRAVENOUS | Status: DC | PRN
  Administered 2017-09-24: 1000 mg via INTRAVENOUS

## 2017-09-24 MED ORDER — ONDANSETRON HCL 4 MG/2ML IJ SOLN
INTRAMUSCULAR | Status: AC
  Filled 2017-09-24: qty 2

## 2017-09-24 MED ORDER — KETOROLAC TROMETHAMINE 30 MG/ML IJ SOLN
INTRAMUSCULAR | Status: DC | PRN
  Administered 2017-09-24: 30 mg via INTRAVENOUS

## 2017-09-24 MED ORDER — EPHEDRINE SULFATE 50 MG/ML IJ SOLN
INTRAMUSCULAR | Status: DC | PRN
  Administered 2017-09-24 (×3): 10 mg via INTRAVENOUS

## 2017-09-24 MED ORDER — HYDROMORPHONE HCL 1 MG/ML IJ SOLN
INTRAMUSCULAR | Status: DC | PRN
  Administered 2017-09-24: 1 mg via INTRAVENOUS

## 2017-09-24 MED ORDER — ACETAMINOPHEN 325 MG PO TABS: 650.0000 mg | ORAL_TABLET | ORAL | Status: DC | PRN

## 2017-09-24 MED ORDER — OXYCODONE-ACETAMINOPHEN 5-325 MG PO TABS: 1.0000 | ORAL_TABLET | ORAL | 0 refills | Status: DC | PRN

## 2017-09-24 MED ORDER — PHENYLEPHRINE HCL 10 MG/ML IJ SOLN
INTRAMUSCULAR | Status: AC
  Filled 2017-09-24: qty 1

## 2017-09-24 MED ORDER — CEFOXITIN SODIUM-DEXTROSE 2-2.2 GM-%(50ML) IV SOLR
2.0000 g | INTRAVENOUS | Status: AC
  Administered 2017-09-24: 2 g via INTRAVENOUS

## 2017-09-24 MED ORDER — LACTATED RINGERS IV SOLN: INTRAVENOUS | Status: DC

## 2017-09-24 MED ORDER — LIDOCAINE HCL (CARDIAC) 20 MG/ML IV SOLN
INTRAVENOUS | Status: DC | PRN
  Administered 2017-09-24: 50 mg via INTRAVENOUS

## 2017-09-24 MED ORDER — FENTANYL CITRATE (PF) 100 MCG/2ML IJ SOLN
25.0000 ug | INTRAMUSCULAR | Status: DC | PRN
  Administered 2017-09-24 (×4): 25 ug via INTRAVENOUS

## 2017-09-24 MED ORDER — PHENYLEPHRINE HCL 10 MG/ML IJ SOLN
INTRAMUSCULAR | Status: DC | PRN
  Administered 2017-09-24 (×3): 100 ug via INTRAVENOUS
  Administered 2017-09-24: 200 ug via INTRAVENOUS
  Administered 2017-09-24 (×6): 100 ug via INTRAVENOUS

## 2017-09-24 MED ORDER — MIDAZOLAM HCL 5 MG/5ML IJ SOLN
INTRAMUSCULAR | Status: AC
  Filled 2017-09-24: qty 5

## 2017-09-24 MED ORDER — DEXAMETHASONE SODIUM PHOSPHATE 10 MG/ML IJ SOLN
INTRAMUSCULAR | Status: DC | PRN
  Administered 2017-09-24: 10 mg via INTRAVENOUS

## 2017-09-24 MED ORDER — KETAMINE HCL 10 MG/ML IJ SOLN
INTRAMUSCULAR | Status: DC | PRN
  Administered 2017-09-24: 12.5 mg via INTRAVENOUS
  Administered 2017-09-24: 37.5 mg via INTRAVENOUS

## 2017-09-24 MED ORDER — HYDROMORPHONE HCL 1 MG/ML IJ SOLN
INTRAMUSCULAR | Status: AC
  Filled 2017-09-24: qty 2

## 2017-09-24 MED ORDER — ROCURONIUM BROMIDE 50 MG/5ML IV SOLN
INTRAVENOUS | Status: AC
  Filled 2017-09-24: qty 1

## 2017-09-24 MED ORDER — DEXAMETHASONE SODIUM PHOSPHATE 10 MG/ML IJ SOLN
INTRAMUSCULAR | Status: AC
  Filled 2017-09-24: qty 1

## 2017-09-24 MED ORDER — CEFOXITIN SODIUM-DEXTROSE 2-2.2 GM-%(50ML) IV SOLR
INTRAVENOUS | Status: AC
  Filled 2017-09-24: qty 50

## 2017-09-24 MED ORDER — KETAMINE HCL 50 MG/ML IJ SOLN
INTRAMUSCULAR | Status: AC
  Filled 2017-09-24: qty 10

## 2017-09-24 MED ORDER — BUPIVACAINE HCL (PF) 0.5 % IJ SOLN
INTRAMUSCULAR | Status: DC | PRN
  Administered 2017-09-24: 15 mL

## 2017-09-24 MED ORDER — GLYCOPYRROLATE 0.2 MG/ML IJ SOLN
INTRAMUSCULAR | Status: DC | PRN
  Administered 2017-09-24: 0.1 mg via INTRAVENOUS

## 2017-09-24 MED ORDER — SUGAMMADEX SODIUM 200 MG/2ML IV SOLN
INTRAVENOUS | Status: AC
  Filled 2017-09-24: qty 2

## 2017-09-24 MED ORDER — PROMETHAZINE HCL 25 MG/ML IJ SOLN: 6.2500 mg | INTRAMUSCULAR | Status: DC | PRN

## 2017-09-24 MED ORDER — FENTANYL CITRATE (PF) 100 MCG/2ML IJ SOLN
INTRAMUSCULAR | Status: AC
  Administered 2017-09-24: 25 ug via INTRAVENOUS
  Filled 2017-09-24: qty 2

## 2017-09-24 MED ORDER — EPHEDRINE SULFATE 50 MG/ML IJ SOLN
INTRAMUSCULAR | Status: AC
  Filled 2017-09-24: qty 1

## 2017-09-24 MED ORDER — ACETAMINOPHEN NICU IV SYRINGE 10 MG/ML
INTRAVENOUS | Status: AC
  Filled 2017-09-24: qty 1

## 2017-09-24 MED ORDER — BUPIVACAINE HCL (PF) 0.5 % IJ SOLN
INTRAMUSCULAR | Status: AC
  Filled 2017-09-24: qty 30

## 2017-09-24 MED ORDER — SUGAMMADEX SODIUM 200 MG/2ML IV SOLN
INTRAVENOUS | Status: DC | PRN
  Administered 2017-09-24: 200 mg via INTRAVENOUS

## 2017-09-24 MED ORDER — ONDANSETRON HCL 4 MG/2ML IJ SOLN
INTRAMUSCULAR | Status: DC | PRN
  Administered 2017-09-24: 4 mg via INTRAVENOUS

## 2017-09-24 MED ORDER — GLYCOPYRROLATE 0.2 MG/ML IJ SOLN
INTRAMUSCULAR | Status: AC
  Filled 2017-09-24: qty 1

## 2017-09-24 MED ORDER — HYDROMORPHONE HCL 1 MG/ML IJ SOLN
0.5000 mg | INTRAMUSCULAR | Status: DC | PRN
  Administered 2017-09-24 (×3): 0.5 mg via INTRAVENOUS

## 2017-09-24 MED ORDER — ROCURONIUM BROMIDE 100 MG/10ML IV SOLN
INTRAVENOUS | Status: DC | PRN
  Administered 2017-09-24: 50 mg via INTRAVENOUS

## 2017-09-24 MED ORDER — MIDAZOLAM HCL 2 MG/2ML IJ SOLN
INTRAMUSCULAR | Status: DC | PRN
  Administered 2017-09-24: 2 mg via INTRAVENOUS
  Administered 2017-09-24: 3 mg via INTRAVENOUS

## 2017-09-24 MED ORDER — ACETAMINOPHEN 650 MG RE SUPP
650.0000 mg | RECTAL | Status: DC | PRN
  Filled 2017-09-24: qty 1

## 2017-09-24 MED ORDER — HYDROMORPHONE HCL 1 MG/ML IJ SOLN
INTRAMUSCULAR | Status: AC
  Administered 2017-09-24: 0.5 mg via INTRAVENOUS
  Filled 2017-09-24: qty 1

## 2017-09-24 MED ORDER — PROPOFOL 10 MG/ML IV BOLUS
INTRAVENOUS | Status: DC | PRN
  Administered 2017-09-24: 50 mg via INTRAVENOUS
  Administered 2017-09-24: 150 mg via INTRAVENOUS

## 2017-09-24 MED ORDER — KETOROLAC TROMETHAMINE 30 MG/ML IJ SOLN
30.0000 mg | Freq: Four times a day (QID) | INTRAMUSCULAR | Status: DC
  Filled 2017-09-24 (×5): qty 1

## 2017-09-24 MED ORDER — LACTATED RINGERS IV SOLN
INTRAVENOUS | Status: DC
  Administered 2017-09-24 (×2): via INTRAVENOUS

## 2017-09-24 MED ORDER — OXYCODONE-ACETAMINOPHEN 5-325 MG PO TABS
ORAL_TABLET | ORAL | Status: AC
  Filled 2017-09-24: qty 1

## 2017-09-24 SURGICAL SUPPLY — 49 items
BAG URINE DRAINAGE (UROLOGICAL SUPPLIES) ×2 IMPLANT
BLADE SURG SZ11 CARB STEEL (BLADE) ×2 IMPLANT
CANISTER SUCT 1200ML W/VALVE (MISCELLANEOUS) ×2 IMPLANT
CATH FOLEY 2WAY  5CC 16FR (CATHETERS) ×1
CATH URTH 16FR FL 2W BLN LF (CATHETERS) ×1 IMPLANT
CHLORAPREP W/TINT 26ML (MISCELLANEOUS) ×2 IMPLANT
DEFOGGER SCOPE WARMER CLEARIFY (MISCELLANEOUS) ×2 IMPLANT
DERMABOND ADVANCED (GAUZE/BANDAGES/DRESSINGS) ×1
DERMABOND ADVANCED .7 DNX12 (GAUZE/BANDAGES/DRESSINGS) ×1 IMPLANT
DEVICE SUTURE ENDOST 10MM (ENDOMECHANICALS) ×2 IMPLANT
DRAPE CAMERA CLOSED 9X96 (DRAPES) ×2 IMPLANT
DRSG TEGADERM 2-3/8X2-3/4 SM (GAUZE/BANDAGES/DRESSINGS) IMPLANT
ENDOSTITCH 0 SINGLE 48 (SUTURE) IMPLANT
GLOVE BIO SURGEON STRL SZ8 (GLOVE) ×10 IMPLANT
GLOVE INDICATOR 8.0 STRL GRN (GLOVE) ×2 IMPLANT
GOWN STRL REUS W/ TWL LRG LVL3 (GOWN DISPOSABLE) ×1 IMPLANT
GOWN STRL REUS W/ TWL XL LVL3 (GOWN DISPOSABLE) ×2 IMPLANT
GOWN STRL REUS W/TWL LRG LVL3 (GOWN DISPOSABLE) ×1
GOWN STRL REUS W/TWL XL LVL3 (GOWN DISPOSABLE) ×2
GRASPER SUT TROCAR 14GX15 (MISCELLANEOUS) ×2 IMPLANT
IRRIGATION STRYKERFLOW (MISCELLANEOUS) ×1 IMPLANT
IRRIGATOR STRYKERFLOW (MISCELLANEOUS) ×2
IV LACTATED RINGERS 1000ML (IV SOLUTION) ×4 IMPLANT
KIT PINK PAD W/HEAD ARE REST (MISCELLANEOUS) ×2
KIT PINK PAD W/HEAD ARM REST (MISCELLANEOUS) ×1 IMPLANT
KIT TURNOVER CYSTO (KITS) ×2 IMPLANT
LABEL OR SOLS (LABEL) ×2 IMPLANT
MANIPULATOR VCARE LG CRV RETR (MISCELLANEOUS) IMPLANT
MANIPULATOR VCARE SML CRV RETR (MISCELLANEOUS) IMPLANT
MANIPULATOR VCARE STD CRV RETR (MISCELLANEOUS) IMPLANT
NEEDLE VERESS 14GA 120MM (NEEDLE) ×2 IMPLANT
NS IRRIG 500ML POUR BTL (IV SOLUTION) ×2 IMPLANT
OCCLUDER COLPOPNEUMO (BALLOONS) ×2 IMPLANT
PACK GYN LAPAROSCOPIC (MISCELLANEOUS) ×2 IMPLANT
PAD OB MATERNITY 4.3X12.25 (PERSONAL CARE ITEMS) ×2 IMPLANT
PAD PREP 24X41 OB/GYN DISP (PERSONAL CARE ITEMS) ×2 IMPLANT
SCISSORS METZENBAUM CVD 33 (INSTRUMENTS) IMPLANT
SET CYSTO W/LG BORE CLAMP LF (SET/KITS/TRAYS/PACK) ×2 IMPLANT
SHEARS HARMONIC ACE PLUS 36CM (ENDOMECHANICALS) ×2 IMPLANT
SLEEVE ENDOPATH XCEL 5M (ENDOMECHANICALS) ×2 IMPLANT
SPONGE GAUZE 2X2 8PLY STRL LF (GAUZE/BANDAGES/DRESSINGS) IMPLANT
SUT ENDO VLOC 180-0-8IN (SUTURE) IMPLANT
SUT VIC AB 0 CT1 36 (SUTURE) ×2 IMPLANT
SUT VIC AB 4-0 FS2 27 (SUTURE) ×2 IMPLANT
SYR 10ML LL (SYRINGE) ×2 IMPLANT
SYR 50ML LL SCALE MARK (SYRINGE) ×2 IMPLANT
TROCAR ENDO BLADELESS 11MM (ENDOMECHANICALS) ×2 IMPLANT
TROCAR XCEL NON-BLD 5MMX100MML (ENDOMECHANICALS) ×2 IMPLANT
TUBING INSUF HEATED (TUBING) ×2 IMPLANT

## 2017-09-24 NOTE — Op Note (Signed)
Operative Report:  PRE-OP DIAGNOSIS: PELVIC PAIN,DYSMENORRHEA,PRIOR ENDOMETRIAL ABLATION   POST-OP DIAGNOSIS: PELVIC PAIN,DYSMENORRHEA,PRIOR ENDOMETRIAL ABLATION   PROCEDURE: Procedure(s): HYSTERECTOMY TOTAL LAPAROSCOPIC CYSTOSCOPY  SURGEON: Annamarie Major, MD, FACOG  ASSISTANT: Dr Edison Pace;  No other capable assistant available, in surgery requiring high level assistant.  ANESTHESIA: General endotracheal anesthesia  ESTIMATED BLOOD LOSS: Min, < 20 mL  SPECIMENS: Uterus.  COMPLICATIONS: None  DISPOSITION: stable to PACU  FINDINGS: Intraabdominal adhesions were noted mostly left sided.  Left adnexa already absent.  Right ovary normal.  PROCEDURE:  The patient was taken to the OR where anesthesia was administed. She was prepped and draped in the normal sterile fashion in the dorsal lithotomy position in the [place] stirrups. A time out was performed. A Graves speculum was inserted, the cervix was grasped with a single tooth tenaculum and the endometrial cavity was sounded. The cervix was progressively dilated to a size 18 [place] with [employer] dilators. A V-Care uterine manipulator was inserted in the usual fashion without incident. Gloves were changed and attention was turned to the abdomen.   An infraumbilical transverse 5mm skin incision was made with the scalpel after local anesthesia applied to the skin. A Veress-step needle was inserted in the usual fashion and confirmed using the hanging drop technique. A pneumoperitoneum was obtained by insufflation of CO2 (opening pressure of ) to . A diagnostic laparoscopy was performed yielding the previously described findings. Attention was turned to the left lower quadrant where after visualization of the inferior epigastric vessels a 5mm skin incision was made with the scalpel. A 5 mm laparoscopic port was inserted. The same procedure was repeated in the right lower quadrant with a 11mm trocar. Attention was turned to the left aspect of  the uterus, where after visualization of the ureter, the round ligament was coagulated and transected using the 5mm Harmonic Scapel. The anterior and posterior leafs of the broad ligament were dissected off as the anterior one was coagulated and transected in a caudal direction towards the cuff of the uterine manipulator. The uterine-ovarian ligament and its blood vessels were carefully coagulated and transected using the Harmonic scapel (prior left salpingo-oophorectomy noted.).  Attention was turned to the right aspect of the uterus where the same procedure was performed, with preservation of the right ovary and its blood supply.  The vesicouterine reflection of the peritoneum was dissected with the harmonic scapel and the bladder flap was created bluntly.  The uterine vessels were coagulated and transected bilaterally using first bipolar cautery and then the harmonic scapel. A 360 degree, circumferential colpotomy was done to completely amputate the uterus with cervix and tubes. Once the specimen was amputated it was delivered through the vagina.   The colpotomy was repaired in a simple running fashion using a delayed absorbable suture with an endo-stitch device.  Vaginal exam confirms complete closure.  The cavity was copiously irrigated. A survey of the pelvic cavity revealed adequate hemostasis and no injury to bowel, bladder, or ureter.   A diagnostic cystoscopy was performed using saline distension of bladder with no lesions or injuries noted.  Bilateral urine flow from each ureteral orifice is visualized.  At this point the procedure was finalized. Right lower quadrant fascia incision is closed with a vicryl suture using the fascia closure device. All the instruments were removed from the patient's body. Gas was expelled and patient is leveled.  Incisions are closed with skin adhesive.    Patient goes to recovery room in stable condition.  All sponge, instrument,  and needle counts are correct x2.      Annamarie MajorPaul Willys Salvino, MD, Merlinda FrederickFACOG [facility], [facility] 09/24/2017  11:30 AM

## 2017-09-24 NOTE — Transfer of Care (Signed)
Immediate Anesthesia Transfer of Care Note  Patient: Cynthia Solis  Procedure(s) Performed: HYSTERECTOMY TOTAL LAPAROSCOPIC (N/A )  Patient Location: PACU  Anesthesia Type:General  Level of Consciousness: drowsy and patient cooperative  Airway & Oxygen Therapy: Patient Spontanous Breathing and Patient connected to face mask oxygen  Post-op Assessment: Report given to RN, Post -op Vital signs reviewed and stable and Patient moving all extremities  Post vital signs: Reviewed and stable  Last Vitals:  Vitals Value Taken Time  BP    Temp    Pulse 72 09/24/2017 11:42 AM  Resp    SpO2 100 % 09/24/2017 11:42 AM  Vitals shown include unvalidated device data.  Last Pain:  Vitals:   09/24/17 1004  TempSrc: Oral  PainSc:          Complications: No apparent anesthesia complications

## 2017-09-24 NOTE — Interval H&P Note (Signed)
History and Physical Interval Note:  09/24/2017 9:34 AM  Cynthia Solis  has presented today for surgery, with the diagnosis of PELVIC PAIN,DYSMENORRHEA,PRIOR ENDOMETRIAL ABLATION  The various methods of treatment have been discussed with the patient and family. After consideration of risks, benefits and other options for treatment, the patient has consented to  Procedure(s): HYSTERECTOMY TOTAL LAPAROSCOPIC (N/A) as a surgical intervention .  The patient's history has been reviewed, patient examined, no change in status, stable for surgery.  I have reviewed the patient's chart and labs.  Questions were answered to the patient's satisfaction.     Letitia Libraobert Paul Kreig Parson

## 2017-09-24 NOTE — Discharge Instructions (Signed)
Total Laparoscopic Hysterectomy, Care After °Refer to this sheet in the next few weeks. These instructions provide you with information on caring for yourself after your procedure. Your health care provider may also give you more specific instructions. Your treatment has been planned according to current medical practices, but problems sometimes occur. Call your health care provider if you have any problems or questions after your procedure. °What can I expect after the procedure? °· Pain and bruising at the incision sites. You will be given pain medicine to control it. °· Menopausal symptoms such as hot flashes, night sweats, and insomnia if your ovaries were removed. °· Sore throat from the breathing tube that was inserted during surgery. °Follow these instructions at home: °· Only take over-the-counter or prescription medicines for pain, discomfort, or fever as directed by your health care provider. °· Do not take aspirin. It can cause bleeding. °· Do not drive when taking pain medicine. °· Follow your health care provider's advice regarding diet, exercise, lifting, driving, and general activities. °· Resume your usual diet as directed and allowed. °· Get plenty of rest and sleep. °· Do not douche, use tampons, or have sexual intercourse for at least 6 weeks, or until your health care provider gives you permission. °· Change your bandages (dressings) as directed by your health care provider. °· Monitor your temperature and notify your health care provider of a fever. °· Take showers instead of baths for 2-3 weeks. °· Do not drink alcohol until your health care provider gives you permission. °· If you develop constipation, you may take a mild laxative with your health care provider's permission. Bran foods may help with constipation problems. Drinking enough fluids to keep your urine clear or pale yellow may help as well. °· Try to have someone home with you for 1-2 weeks to help around the house. °· Keep all of  your follow-up appointments as directed by your health care provider. °Contact a health care provider if: °· You have swelling, redness, or increasing pain around your incision sites. °· You have pus coming from your incision. °· You notice a bad smell coming from your incision. °· Your incision breaks open. °· You feel dizzy or lightheaded. °· You have pain or bleeding when you urinate. °· You have persistent diarrhea. °· You have persistent nausea and vomiting. °· You have abnormal vaginal discharge. °· You have a rash. °· You have any type of abnormal reaction or develop an allergy to your medicine. °· You have poor pain control with your prescribed medicine. °Get help right away if: °· You have chest pain or shortness of breath. °· You have severe abdominal pain that is not relieved with pain medicine. °· You have pain or swelling in your legs. °This information is not intended to replace advice given to you by your health care provider. Make sure you discuss any questions you have with your health care provider. °Document Released: 03/18/2013 Document Revised: 11/03/2015 Document Reviewed: 12/16/2012 °Elsevier Interactive Patient Education © 2017 [employer] ° °AMBULATORY SURGERY  °DISCHARGE INSTRUCTIONS ° ° °1) The drugs that you were given will stay in your system until tomorrow so for the next 24 hours you should not: ° °A) Drive an automobile °B) Make any legal decisions °C) Drink any alcoholic beverage ° ° °2) You may resume regular meals tomorrow.  Today it is better to start with liquids and gradually work up to solid foods. ° °You may eat anything you prefer, but it is better   to start with liquids, then soup and crackers, and gradually work up to solid foods. ° ° °3) Please notify your doctor immediately if you have any unusual bleeding, trouble breathing, redness and pain at the surgery site, drainage, fever, or pain not relieved by medication. ° ° ° °4) Additional Instructions: ° ° ° ° ° ° ° °Please  contact your physician with any problems or Same Day Surgery at [phone], Monday through Friday 6 am to 4 pm, or [place] [facility] [place] Main number at [phone]. ° °

## 2017-09-24 NOTE — Anesthesia Postprocedure Evaluation (Signed)
Anesthesia Post Note  Patient: Cynthia Solis  Procedure(s) Performed: HYSTERECTOMY TOTAL LAPAROSCOPIC (N/A )  Patient location during evaluation: PACU Anesthesia Type: General Level of consciousness: awake and alert Pain management: pain level controlled Vital Signs Assessment: post-procedure vital signs reviewed and stable Respiratory status: spontaneous breathing, nonlabored ventilation, respiratory function stable and patient connected to nasal cannula oxygen Cardiovascular status: blood pressure returned to baseline and stable Postop Assessment: no apparent nausea or vomiting Anesthetic complications: no     Last Vitals:  Vitals:   09/24/17 1338 09/24/17 1458  BP: 101/83 (!) (P) 95/55  Pulse: 70 (P) 64  Resp: 14 (P) 16  Temp: 36.5 C   SpO2: 96% (P) 94%    Last Pain:  Vitals:   09/24/17 1338  TempSrc: Oral  PainSc: 8                  Lenard SimmerAndrew Traycen Goyer

## 2017-09-24 NOTE — Anesthesia Preprocedure Evaluation (Signed)
Anesthesia Evaluation  Patient identified by MRN, date of birth, ID band Patient awake    Reviewed: Allergy & Precautions, H&P , NPO status , Patient's Chart, lab work & pertinent test results, reviewed documented beta blocker date and time   History of Anesthesia Complications Negative for: history of anesthetic complications  Airway Mallampati: I  TM Distance: >3 FB Neck ROM: full    Dental  (+) Caps, Dental Advidsory Given, Teeth Intact   Pulmonary neg pulmonary ROS,           Cardiovascular Exercise Tolerance: Good (-) hypertension(-) angina(-) CAD, (-) Past MI, (-) Cardiac Stents and (-) CABG + dysrhythmias Atrial Fibrillation + Valvular Problems/Murmurs MR      Neuro/Psych PSYCHIATRIC DISORDERS Anxiety Depression negative neurological ROS     GI/Hepatic Neg liver ROS, GERD  ,  Endo/Other  negative endocrine ROS  Renal/GU negative Renal ROS  negative genitourinary   Musculoskeletal   Abdominal   Peds  Hematology negative hematology ROS (+)   Anesthesia Other Findings Past Medical History: No date: Anxiety No date: Atrial flutter ([place]) 06/22/2016: BRCA gene mutation negative in female     Comment:  3/16 pt MyRisk neg; sister is RAD51C pos; mom is BRCA               neg. No date: Depression No date: Dysrhythmia     Comment:  H/O ATRIAL FLUTTER-PT STATES METOPROLOL DOSE HAS BEEN               INCREASED AND PALPITATIONS ARE NOW UNDER CONTROL 08/2014: Family history of breast cancer     Comment:  IBIS = 18% 03/28/2015: Family history of ovarian cancer     Comment:  PT'S SISTER IS RAD51C POS, PT IS NEG. No date: Fracture of lumbar spine (HCC) No date: GERD (gastroesophageal reflux disease) No date: Heart murmur     Comment:  since a child 06/27/2014: History of abnormal mammogram     Comment:  NEG No date: History of chicken pox No date: History of kidney stones 04/20/2014: History of Papanicolaou smear  of cervix     Comment:  NEG CT NEG No date: Mitral regurgitation No date: Obesity No date: Pelvic pain   Reproductive/Obstetrics negative OB ROS                             Anesthesia Physical Anesthesia Plan  ASA: II  Anesthesia Plan: General   Post-op Pain Management:    Induction: Intravenous  PONV Risk Score and Plan: 3 and Ondansetron and Dexamethasone  Airway Management Planned: Oral ETT  Additional Equipment:   Intra-op Plan:   Post-operative Plan: Extubation in OR  Informed Consent: I have reviewed the patients History and Physical, chart, labs and discussed the procedure including the risks, benefits and alternatives for the proposed anesthesia with the patient or authorized representative who has indicated his/her understanding and acceptance.   Dental Advisory Given  Plan Discussed with: Anesthesiologist, CRNA and Surgeon  Anesthesia Plan Comments:         Anesthesia Quick Evaluation

## 2017-09-24 NOTE — Anesthesia Procedure Notes (Signed)
Performed by: Sherol DadeMacMang, Drue Camera H, CRNA

## 2017-09-24 NOTE — OR Nursing (Signed)
Mild nausea and moderate to sever pain in abd.  Safety assessment complete.  Peppermint oil on Cotton ball for inhalation.

## 2017-09-24 NOTE — Anesthesia Procedure Notes (Signed)
Procedure Name: Intubation Date/Time: 09/24/2017 10:22 AM Performed by: Sherol DadeMacMang, Ayush Boulet H, CRNA Pre-anesthesia Checklist: Patient identified, Emergency Drugs available, Suction available, Patient being monitored and Timeout performed Patient Re-evaluated:Patient Re-evaluated prior to induction Oxygen Delivery Method: Circle system utilized Preoxygenation: Pre-oxygenation with 100% oxygen Induction Type: IV induction and Cricoid Pressure applied Ventilation: Mask ventilation without difficulty Laryngoscope Size: Miller and 2 Grade View: Grade I Tube type: Oral Tube size: 7.5 mm Number of attempts: 1 Airway Equipment and Method: Stylet Placement Confirmation: ETT inserted through vocal cords under direct vision,  positive ETCO2,  CO2 detector and breath sounds checked- equal and bilateral Secured at: 21 cm Tube secured with: Tape Dental Injury: Teeth and Oropharynx as per pre-operative assessment

## 2017-09-24 NOTE — Anesthesia Post-op Follow-up Note (Signed)
Anesthesia QCDR form completed.        

## 2017-09-25 ENCOUNTER — Encounter: Payer: Self-pay | Admitting: Obstetrics & Gynecology

## 2017-09-25 LAB — ABO/RH: ABO/RH(D): O POS

## 2017-09-26 LAB — SURGICAL PATHOLOGY

## 2017-10-07 ENCOUNTER — Encounter: Payer: Self-pay | Admitting: Obstetrics & Gynecology

## 2017-10-07 ENCOUNTER — Ambulatory Visit (INDEPENDENT_AMBULATORY_CARE_PROVIDER_SITE_OTHER): Payer: BC Managed Care – PPO | Admitting: Obstetrics & Gynecology

## 2017-10-07 VITALS — BP 100/70 | HR 59 | Ht 62.0 in | Wt 209.0 lb

## 2017-10-07 DIAGNOSIS — N946 Dysmenorrhea, unspecified: Secondary | ICD-10-CM

## 2017-10-07 DIAGNOSIS — Z90710 Acquired absence of both cervix and uterus: Secondary | ICD-10-CM

## 2017-10-07 DIAGNOSIS — Z1239 Encounter for other screening for malignant neoplasm of breast: Secondary | ICD-10-CM

## 2017-10-07 DIAGNOSIS — Z1231 Encounter for screening mammogram for malignant neoplasm of breast: Secondary | ICD-10-CM

## 2017-10-07 NOTE — Addendum Note (Signed)
Addended by: Nadara Mustard on: 10/07/2017 08:42 AM   Modules accepted: Orders

## 2017-10-07 NOTE — Progress Notes (Signed)
  [name] presents post op from [facility] for pelvic pain, 2 weeks ago. Images:      Pathology: DIAGNOSIS:  A. UTERUS WITH CERVIX; HYSTERECTOMY:  - CERVIX WITHOUT PATHOLOGIC CHANGES.  - FOCAL RESIDUAL BASALIS ENDOMETRIUM, AND SCARRING.  - FOCAL ADENOMYOSIS.   BILATERAL FALLOPIAN TUBES; SALPINGECTOMY:  - SEROSAL ENDOMETRIOSIS ADJACENT TO ONE PROXIMAL REMNANT.   Subjective: Patient reports marked improvement in her preop symptoms. Eating a regular diet without difficulty. The patient is not having any pain. Mild R Inc soreness.  Activity: normal activities of daily living. Patient reports vaginal sx's of scanty VB.  Objective: There were no vitals taken for this visit. Physical Exam  Constitutional: She is oriented to person, place, and time. She appears well-developed and well-nourished. No distress.  Cardiovascular: Normal rate.  Pulmonary/Chest: Effort normal.  Abdominal: Soft. She exhibits no distension. There is no tenderness.  Incision Healing Well   Musculoskeletal: Normal range of motion.  Neurological: She is alert and oriented to person, place, and time. No cranial nerve deficit.  Skin: Skin is warm and dry.  Psychiatric: She has a normal mood and affect.    Assessment: s/p :  total laparoscopic hysterectomy with bilateral salpingectomy progressing well  Plan: Patient has done well after surgery with no apparent complications.  I have discussed the post-operative course to date, and the expected progress moving forward.  The patient understands what complications to be concerned about.  I will see the patient in routine follow up, or sooner if needed.    Activity plan: No restriction. Pelvic rest.  Cynthia Solis 10/07/2017, 7:45 AM

## 2017-10-10 ENCOUNTER — Encounter: Payer: Self-pay | Admitting: Obstetrics & Gynecology

## 2017-10-10 NOTE — Telephone Encounter (Signed)
See email, she can pick up work form

## 2017-10-10 NOTE — Telephone Encounter (Signed)
Pt called stating she would like [facility] nurse call her back regarding the work form [employer] filled out. She did not give any other information. CB# [phone]

## 2017-10-11 NOTE — Telephone Encounter (Signed)
Pt aware of note changes, just needs to know if discharge is normal

## 2017-10-15 ENCOUNTER — Encounter: Payer: Self-pay | Admitting: Obstetrics & Gynecology

## 2017-11-06 ENCOUNTER — Encounter: Payer: Self-pay | Admitting: Obstetrics & Gynecology

## 2017-11-06 ENCOUNTER — Ambulatory Visit (INDEPENDENT_AMBULATORY_CARE_PROVIDER_SITE_OTHER): Payer: BC Managed Care – PPO | Admitting: Obstetrics & Gynecology

## 2017-11-06 VITALS — BP 118/80 | HR 74 | Ht 62.0 in | Wt 209.0 lb

## 2017-11-06 DIAGNOSIS — N946 Dysmenorrhea, unspecified: Secondary | ICD-10-CM

## 2017-11-06 DIAGNOSIS — Z90710 Acquired absence of both cervix and uterus: Secondary | ICD-10-CM

## 2017-11-06 NOTE — Progress Notes (Signed)
  [name] presents post op from [facility] for pelvic pain and dysmenorrhea, 6 weeks ago.  Subjective: Patient reports marked improvement in her preop symptoms. Eating a regular diet without difficulty. The patient is not having any pain.  Activity: normal activities of daily living. Patient reports vaginal sx's of spotting at times when wiping after using bathroom; wears pad and no other bleeding noted on pad.  Objective: BP 118/80   Pulse 74   Ht  (1.575 m)   Wt 209 lb (94.8 kg)   LMP 01/18/2017 (Approximate)   BMI 38.23 kg/m  Physical Exam  Constitutional: She is oriented to person, place, and time. She appears well-developed and well-nourished. No distress.  Genitourinary: Rectum normal and vagina normal. Pelvic exam was performed with patient supine. There is no rash, tenderness or lesion on the right labia. There is no rash, tenderness or lesion on the left labia. No erythema or bleeding in the vagina. Right adnexum does not display mass and does not display tenderness. Left adnexum does not display mass and does not display tenderness.  Genitourinary Comments: Cervix and uterus absent. Vaginal cuff healing well.  Cardiovascular: Normal rate.  Pulmonary/Chest: Effort normal.  Abdominal: Soft. She exhibits no distension. There is no tenderness.  Incision healing well.  Musculoskeletal: Normal range of motion.  Neurological: She is alert and oriented to person, place, and time. No cranial nerve deficit.  Skin: Skin is warm and dry.  Psychiatric: She has a normal mood and affect.   Assessment: s/p :  total laparoscopic hysterectomy with bilateral salpingectomy progressing well  Plan: Patient has done well after surgery with no apparent complications.  I have discussed the post-operative course to date, and the expected progress moving forward.  The patient understands what complications to be concerned about.  I will see the patient in routine follow up, or  sooner if needed.    Activity plan: No restriction. Monitor bleeding and restrict sex until bleeding has resolved.  Letitia Libra 11/06/2017, 8:19 AM

## 2017-11-12 ENCOUNTER — Ambulatory Visit
Admission: RE | Admit: 2017-11-12 | Discharge: 2017-11-12 | Disposition: A | Discharge status: Home / Self Care | Payer: BC Managed Care – PPO | Source: Ambulatory Visit | Attending: Obstetrics & Gynecology | Admitting: Obstetrics & Gynecology

## 2017-11-12 DIAGNOSIS — Z1231 Encounter for screening mammogram for malignant neoplasm of breast: Secondary | ICD-10-CM | POA: Diagnosis present

## 2017-11-12 DIAGNOSIS — Z1239 Encounter for other screening for malignant neoplasm of breast: Secondary | ICD-10-CM

## 2017-12-12 IMAGING — CT CT ABD-PELV W/ CM
1 of 2 series · 15 of 32 positions shown, 19 images · IV contrast (APPLIED)
Comparison: Abdominal ultrasound 03/10/2015.

CLINICAL DATA: Patient with nausea, abdominal pain and bloating for
4-6 weeks. History of rectal bleeding.

EXAM:
CT ABDOMEN AND PELVIS WITH CONTRAST
TECHNIQUE: Multidetector CT imaging of the abdomen and pelvis was performed
using the standard protocol following bolus administration of
intravenous contrast.
CONTRAST:  100mL OMNIPAQUE IOHEXOL 300 MG/ML  SOLN

[Series 2: axial st · axial · 0.71mm/px · z∈[-944,-514]mm · 15 of 94 slices shown, 19 images]
[im 4/94  soft-tissue]
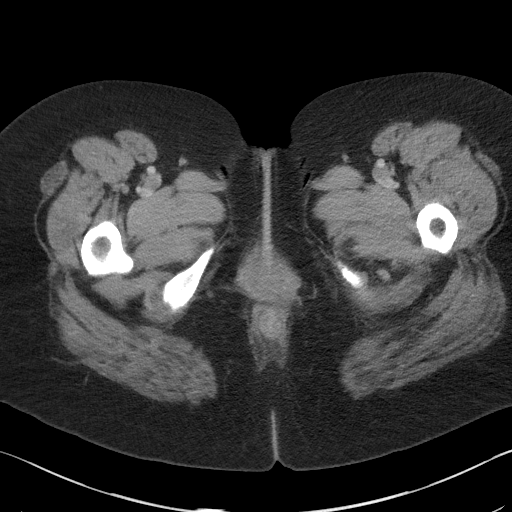
[im 4/94  bone]
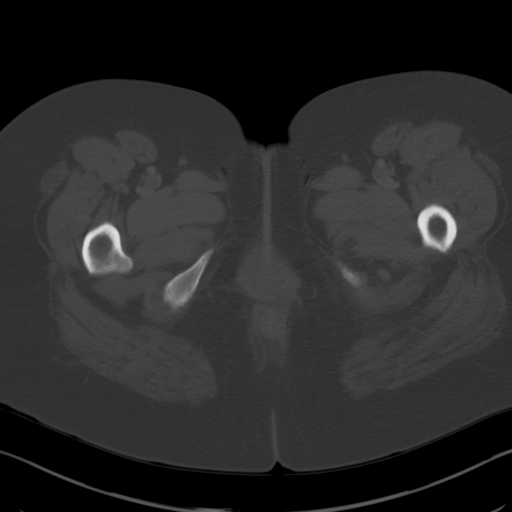
[im 12/94  soft-tissue]
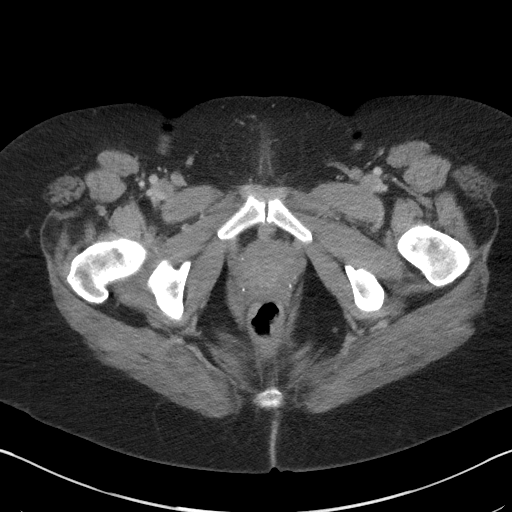
[im 20/94  soft-tissue]
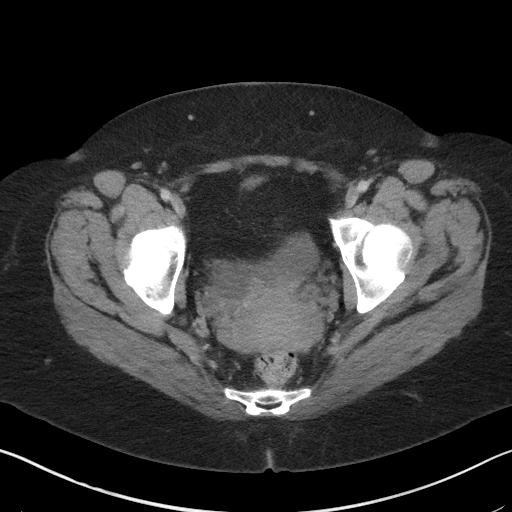
[im 28/94  soft-tissue]
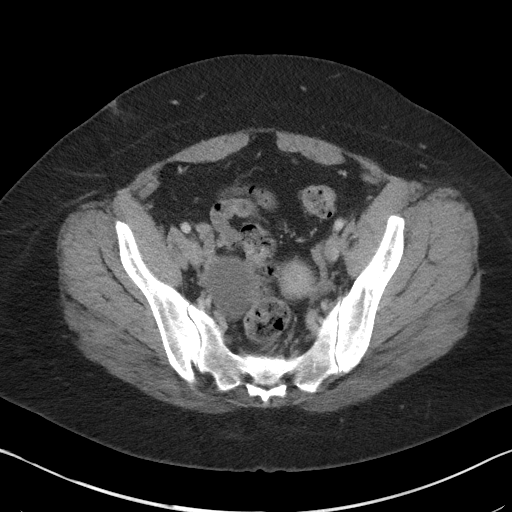
[im 32/94  soft-tissue]
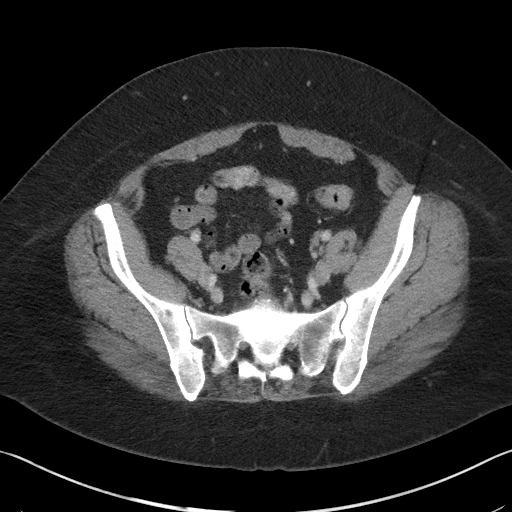
[im 39/94  soft-tissue]
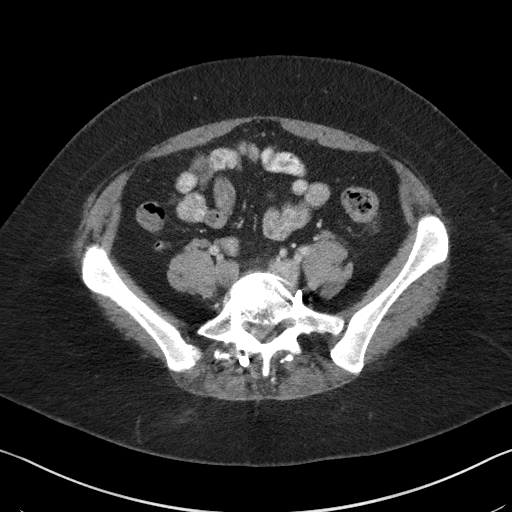
[im 47/94  soft-tissue]
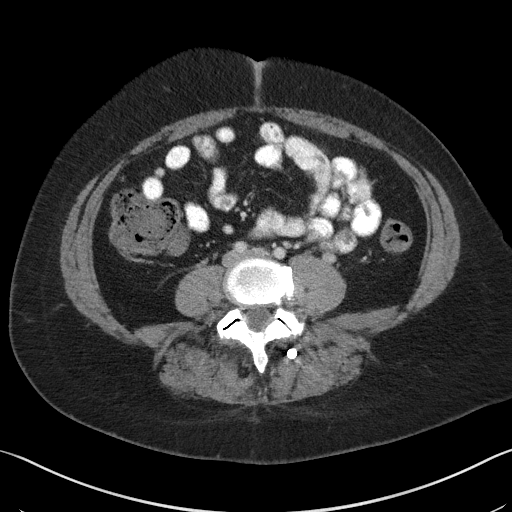
[im 55/94  soft-tissue]
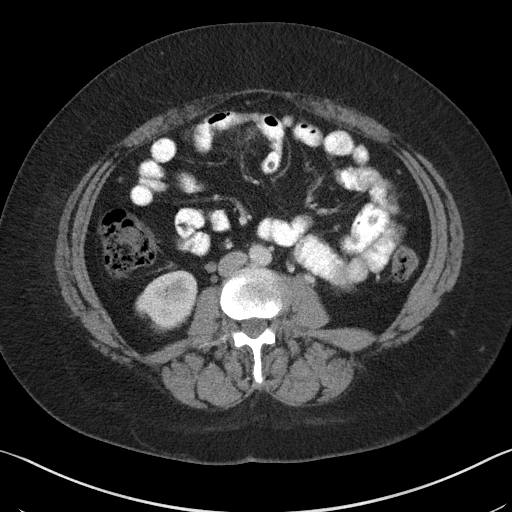
[im 63/94  soft-tissue]
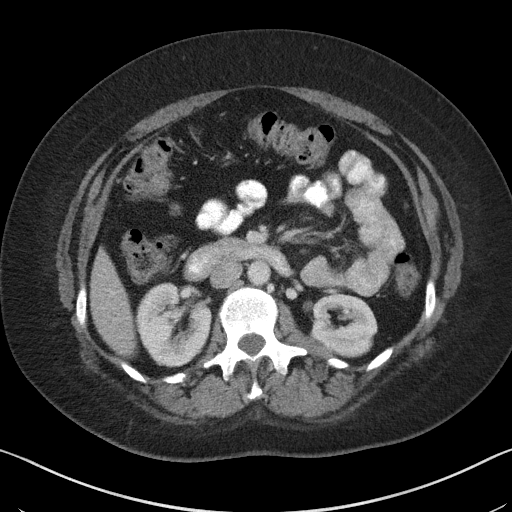
[im 63/94  bone]
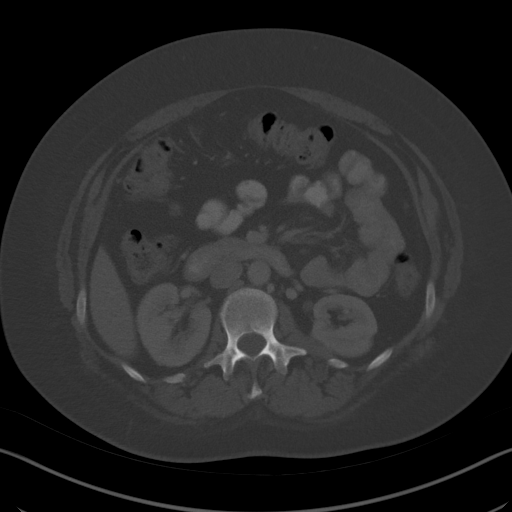
[im 66/94  soft-tissue]
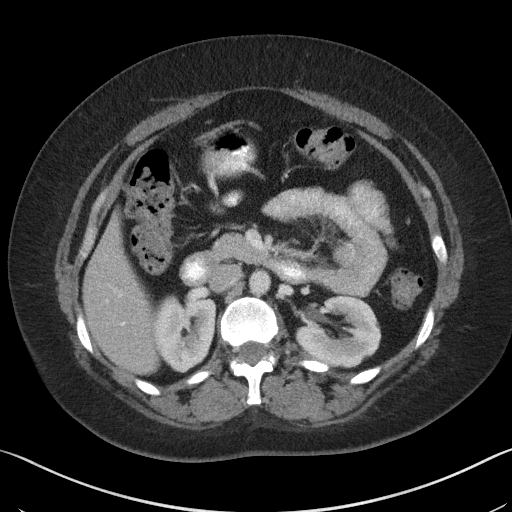
[im 74/94  soft-tissue]
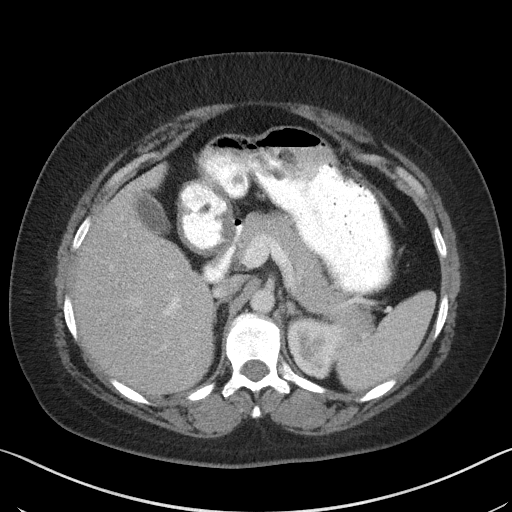
[im 78/94  lung]
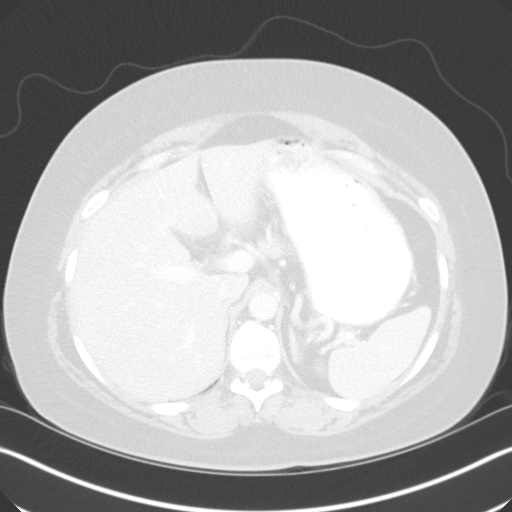
[im 82/94  soft-tissue]
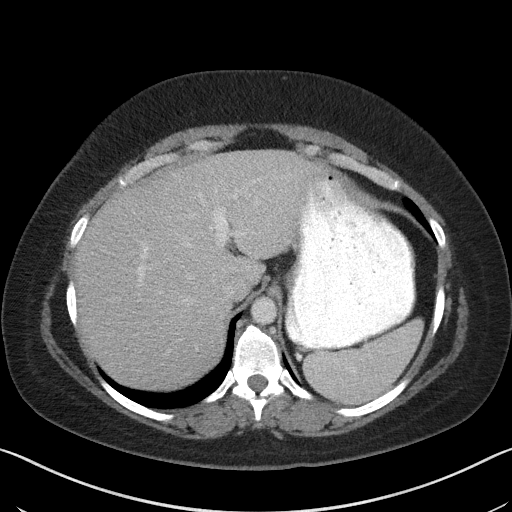
[im 82/94  lung]
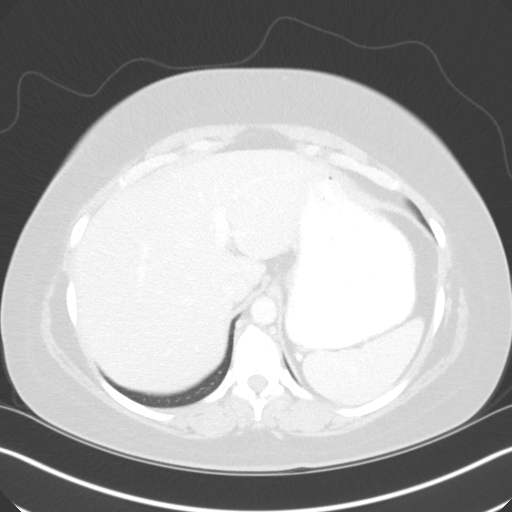
[im 86/94  lung]
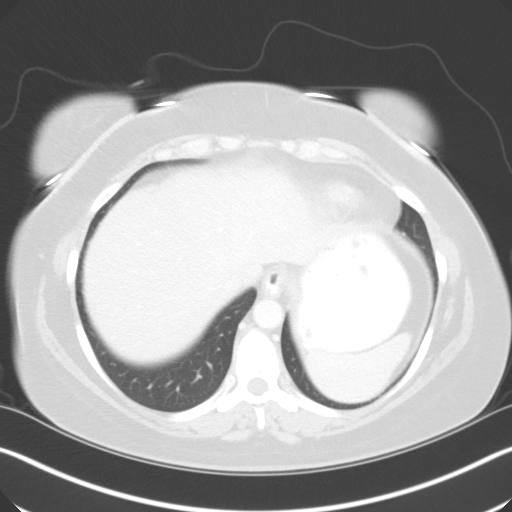
[im 90/94  soft-tissue]
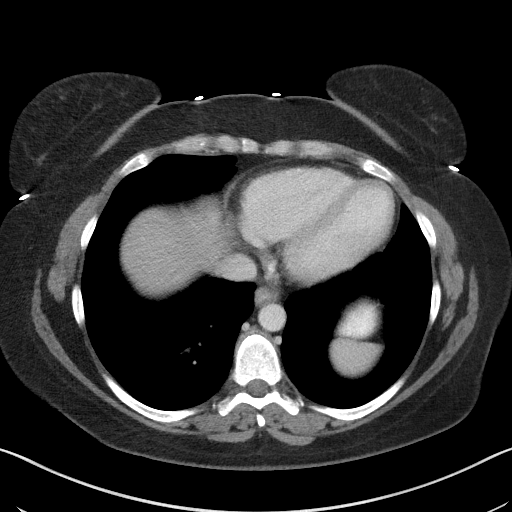
[im 90/94  lung]
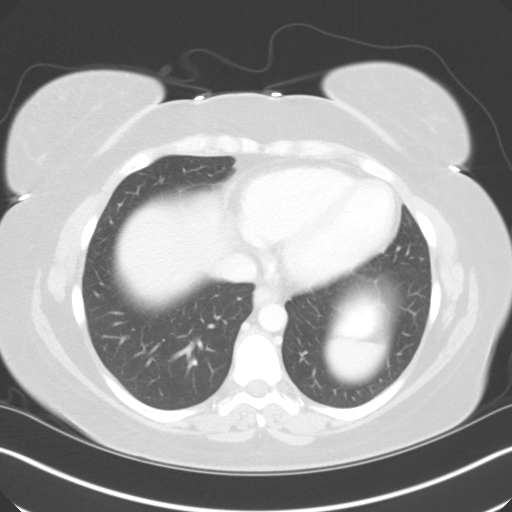

[15 of 32 positions shown; findings below may reference images not displayed]

FINDINGS: Lower chest: Normal heart size. No consolidative or nodular
pulmonary opacities. No pleural effusion.

Hepatobiliary: Liver is normal in size and contour. No focal hepatic
lesion is identified. Gallbladder is unremarkable. No intrahepatic
or extrahepatic biliary ductal dilatation.

Pancreas: Unremarkable

Spleen: Unremarkable

Adrenals/Urinary Tract: The adrenal glands are normal. The kidneys
are lobular contour. There is a 2 mm nonobstructing stone within the
interpolar region of the left kidney (image 30; series 2). No
hydronephrosis. Urinary bladder is unremarkable.

Stomach/Bowel: The appendix is normal. No abnormal bowel wall
thickening or evidence for bowel obstruction. No free fluid or free
intraperitoneal air. Stomach is normal morphology.

Vascular/Lymphatic: Normal caliber abdominal aorta. No
retroperitoneal lymphadenopathy.

Other: Intrauterine device is present and appears in appropriate
position. There is a 6.1 x 4.8 cm cystic structure within the right
adnexa.

Musculoskeletal: Lumbar spinal fusion hardware. No aggressive or
acute appearing osseous lesions.
IMPRESSION: No acute process within the abdomen or pelvis.

Nonobstructing 2 mm stone interpolar region left kidney.

## 2018-11-05 ENCOUNTER — Encounter: Payer: Self-pay | Admitting: Obstetrics & Gynecology

## 2018-11-05 ENCOUNTER — Other Ambulatory Visit: Payer: Self-pay

## 2018-11-05 ENCOUNTER — Ambulatory Visit (INDEPENDENT_AMBULATORY_CARE_PROVIDER_SITE_OTHER): Payer: BC Managed Care – PPO | Admitting: Obstetrics & Gynecology

## 2018-11-05 VITALS — BP 120/80 | Ht 62.0 in | Wt 203.0 lb

## 2018-11-05 DIAGNOSIS — Z1239 Encounter for other screening for malignant neoplasm of breast: Secondary | ICD-10-CM

## 2018-11-05 DIAGNOSIS — Z01419 Encounter for gynecological examination (general) (routine) without abnormal findings: Secondary | ICD-10-CM

## 2018-11-05 DIAGNOSIS — N3941 Urge incontinence: Secondary | ICD-10-CM | POA: Insufficient documentation

## 2018-11-05 DIAGNOSIS — Z13220 Encounter for screening for lipoid disorders: Secondary | ICD-10-CM

## 2018-11-05 DIAGNOSIS — R5383 Other fatigue: Secondary | ICD-10-CM | POA: Insufficient documentation

## 2018-11-05 DIAGNOSIS — Z131 Encounter for screening for diabetes mellitus: Secondary | ICD-10-CM

## 2018-11-05 MED ORDER — MIRABEGRON ER 25 MG PO TB24: 25.0000 mg | ORAL_TABLET | Freq: Every day | ORAL | 11 refills | Status: DC

## 2018-11-05 NOTE — Progress Notes (Signed)
HPI:      Ms. DELISA FINCK is a 43 y.o. [id number] who LMP was in past as she has had TLH, she presents today for her annual examination. The patient has no complaints today other than: 1. Fatigue, almost daily 2. Urgency to urinate and then she can leak.  Wears pad daily.  No sig leakage w cough, sneeze, laughing.  The patient is sexually active. Her last pap: was normal and last mammogram: approximate date 11/2017 and was normal. The patient does perform self breast exams.  There is notable family history of breast or ovarian cancer in her family. Self is BRCA Neg from 2016 testing.  Mother also BRCA neg form years ago. The patient has regular exercise: yes.  The patient denies current symptoms of depression.    GYN History: Contraception: status post hysterectomy  PMHx: Past Medical History:  Diagnosis Date   Anxiety    Atrial flutter ([place])    BRCA gene mutation negative in female 06/22/2016   3/16 pt MyRisk neg; sister is RAD51C pos; mom is BRCA neg.   Depression    Dysrhythmia    H/O ATRIAL FLUTTER-PT STATES METOPROLOL DOSE HAS BEEN INCREASED AND PALPITATIONS ARE NOW UNDER CONTROL   Family history of breast cancer 08/2014   IBIS = 18%   Family history of ovarian cancer 03/28/2015   PT'S SISTER IS RAD51C POS, PT IS NEG.   Fracture of lumbar spine (HCC)    GERD (gastroesophageal reflux disease)    Heart murmur    since a child   History of abnormal mammogram 06/27/2014   NEG   History of chicken pox    History of kidney stones    History of Papanicolaou smear of cervix 04/20/2014   NEG CT NEG   Mitral regurgitation    Obesity    Pelvic pain    Past Surgical History:  Procedure Laterality Date   ABLATION     BACK SURGERY  2008   lumbar 4-5 fusion, titanium   CHOLECYSTECTOMY N/A 04/19/2016   COLONOSCOPY WITH PROPOFOL N/A 07/14/2015   Procedure: COLONOSCOPY WITH PROPOFOL;  Surgeon: Hulen Luster, MD;  Location: [facility] ENDOSCOPY;  Service: Gastroenterology;   Laterality: N/A;   DIAGNOSTIC LAPAROSCOPY  2005   DILATION AND CURETTAGE OF UTERUS  2000   ELBOW SURGERY     right elbow tendon repair   ENDOMETRIAL ABLATION     ESOPHAGOGASTRODUODENOSCOPY N/A 07/14/2015   Procedure: ESOPHAGOGASTRODUODENOSCOPY (EGD);  Surgeon: Hulen Luster, MD;  Location: [facility] ENDOSCOPY;  Service: Gastroenterology;  Laterality: N/A;   INDUCED ABORTION     X2   LAPAROSCOPIC HYSTERECTOMY N/A 09/24/2017   Procedure: HYSTERECTOMY TOTAL LAPAROSCOPIC;  Surgeon: Gae Dry, MD;  Location: [facility] ORS;  Service: Gynecology;  Laterality: N/A;   LAPAROSCOPIC OVARIAN CYSTECTOMY Right 08/11/2015   Procedure: LAPAROSCOPIC OVARIAN CYSTECTOMY;  Surgeon: Gae Dry, MD;  Location: [facility] ORS;  Service: Gynecology;  Laterality: Right;   LAPAROSCOPIC SALPINGO OOPHERECTOMY Left 04/05/2015   Procedure: LAPAROSCOPIC SALPINGO OOPHORECTOMY;  Surgeon: Gae Dry, MD;  Location: [facility] ORS;  Service: Gynecology;  Laterality: Left;   LAPAROSCOPIC UNILATERAL SALPINGECTOMY Right 08/11/2015   Procedure: LAPAROSCOPIC UNILATERAL SALPINGECTOMY;  Surgeon: Gae Dry, MD;  Location: [facility] ORS;  Service: Gynecology;  Laterality: Right;   LAPAROSCOPY N/A 04/05/2015   Procedure: LAPAROSCOPY OPERATIVE;  Surgeon: Gae Dry, MD;  Location: [facility] ORS;  Service: Gynecology;  Laterality: N/A;   Family History  Problem Relation Age of Onset  Ovarian cancer Mother 46       NEG for [place]   Breast cancer Mother 73       49 dx 2nd time pt is NEG for BRACA   Breast cancer Maternal Grandmother 33       53 dx twice    CVA Maternal Grandmother    Dementia Maternal Grandmother    Diabetes Maternal Grandmother    Hypertension Maternal Grandmother    Hyperlipidemia Father    Hypertension Father    Breast cancer Maternal Aunt 8       BRCA NEG; SECOND TIME AGE 36   Dementia Maternal Grandfather    Diabetes Maternal Grandfather    Hypertension Maternal Grandfather    Hypertension  Paternal Grandfather    Heart attack Paternal Grandfather    Cancer Paternal Grandfather    Breast cancer Cousin        BRCA NEG.    Social History   Tobacco Use   Smoking status: Never Smoker   Smokeless tobacco: Never Used  Substance Use Topics   Alcohol use: Yes    Comment: OCC   Drug use: No    Current Outpatient Medications:    ALPRAZolam (XANAX) 0.5 MG tablet, TAKE 0.25 MG BY MOUTH IN THE MORNING AND 0.5 MG BY MOUTH IN THE EVENING AS NEEDED for anxiety, Disp: , Rfl: 0   buPROPion (WELLBUTRIN XL) 300 MG 24 hr tablet, Take 300 mg by mouth every morning. , Disp: , Rfl:    DULoxetine (CYMBALTA) 30 MG capsule, Take 30 mg by mouth every morning. , Disp: , Rfl: 1   DULoxetine (CYMBALTA) 60 MG capsule, Take 60 mg by mouth every morning. , Disp: , Rfl: 2   ibuprofen (ADVIL,MOTRIN) 200 MG tablet, Take 800 mg by mouth every 6 (six) hours as needed for moderate pain., Disp: , Rfl:    metoprolol (LOPRESSOR) 50 MG tablet, Take 50 mg by mouth 2 (two) times daily., Disp: , Rfl:    ondansetron (ZOFRAN-ODT) 8 MG disintegrating tablet, Take 8 mg by mouth every 8 (eight) hours as needed for nausea. for nausea, Disp: , Rfl: 0   pantoprazole (PROTONIX) 40 MG tablet, Take 40 mg by mouth 2 (two) times daily. , Disp: , Rfl:    triamcinolone cream (KENALOG) 0.1 %, Apply 1 application topically daily as needed (rash). , Disp: , Rfl:    doxepin (SINEQUAN) 10 MG capsule, Take 10 mg by mouth at bedtime as needed for sleep., Disp: , Rfl: 5   meclizine (ANTIVERT) 25 MG tablet, Take 25 mg by mouth 3 (three) times daily as needed for dizziness., Disp: , Rfl: 0   meloxicam (MOBIC) 15 MG tablet, Take 1 tablet (15 mg total) 2 (two) times daily as needed by mouth for pain. (Patient not taking: Reported on 11/05/2018), Disp: 60 tablet, Rfl: 3   mirabegron ER (MYRBETRIQ) 25 MG TB24 tablet, Take 1 tablet (25 mg total) by mouth daily., Disp: 30 tablet, Rfl: 11   oxyCODONE-acetaminophen (PERCOCET)  5-325 MG tablet, Take 1 tablet by mouth every 4 (four) hours as needed. (Patient not taking: Reported on 10/07/2017), Disp: 30 tablet, Rfl: 0 Allergies: Avelox [moxifloxacin hcl in nacl] and Sulfa antibiotics  Review of Systems  Constitutional: Negative for chills, fever and malaise/fatigue.  HENT: Negative for congestion, sinus pain and sore throat.   Eyes: Negative for blurred vision and pain.  Respiratory: Negative for cough and wheezing.   Cardiovascular: Negative for chest pain and leg swelling.  Gastrointestinal: Negative  for abdominal pain, constipation, diarrhea, heartburn, nausea and vomiting.  Genitourinary: Negative for dysuria, frequency, hematuria and urgency.  Musculoskeletal: Negative for back pain, joint pain, myalgias and neck pain.  Skin: Negative for itching and rash.  Neurological: Negative for dizziness, tremors and weakness.  Endo/Heme/Allergies: Does not bruise/bleed easily.  Psychiatric/Behavioral: Negative for depression. The patient is not nervous/anxious and does not have insomnia.     Objective: BP 120/80    Ht '5\' 2"'  (1.575 m)    Wt 203 lb (92.1 kg)    LMP 01/18/2017 (Approximate)    BMI 37.13 kg/m   Filed Weights   11/05/18 0808  Weight: 203 lb (92.1 kg)   Body mass index is 37.13 kg/m. Physical Exam Constitutional:      General: She is not in acute distress.    Appearance: She is well-developed.  Genitourinary:     Pelvic exam was performed with patient supine.     Vagina and rectum normal.     No lesions in the vagina.     No vaginal bleeding.     No right or left adnexal mass present.     Right adnexa not tender.     Left adnexa not tender.     Genitourinary Comments: Absent Uterus Absent cervix Vaginal cuff well healed  HENT:     Head: Normocephalic and atraumatic. No laceration.     Right Ear: Hearing normal.     Left Ear: Hearing normal.     Mouth/Throat:     Pharynx: Uvula midline.  Eyes:     Pupils: Pupils are equal, round, and  reactive to light.  Neck:     Musculoskeletal: Normal range of motion and neck supple.     Thyroid: No thyromegaly.  Cardiovascular:     Rate and Rhythm: Normal rate and regular rhythm.     Heart sounds: No murmur. No friction rub. No gallop.   Pulmonary:     Effort: Pulmonary effort is normal. No respiratory distress.     Breath sounds: Normal breath sounds. No wheezing.  Chest:     Breasts:        Right: No mass, skin change or tenderness.        Left: No mass, skin change or tenderness.  Abdominal:     General: Bowel sounds are normal. There is no distension.     Palpations: Abdomen is soft.     Tenderness: There is no abdominal tenderness. There is no rebound.  Musculoskeletal: Normal range of motion.  Neurological:     Mental Status: She is alert and oriented to person, place, and time.     Cranial Nerves: No cranial nerve deficit.  Skin:    General: Skin is warm and dry.  Psychiatric:        Judgment: Judgment normal.  Vitals signs reviewed.     Assessment:  ANNUAL EXAM 1. Women's annual routine gynecological examination   2. Screening for breast cancer   3. Fatigue, unspecified type   4. Screening for cholesterol level   5. Screening for diabetes mellitus   6. Urge incontinence      Screening Plan:            1.  Cervical Screening-  Pap smear schedule reviewed with patient  2. Breast screening- Exam annually and mammogram>40 planned   3. Colonoscopy every 10 years, Hemoccult testing - after age 58  4. Labs Ordered today   5. Fatigue, unspecified type Monitor lifestyle measures that may contribute  Labs to check: - TSH - Vitamin B12 - VITAMIN D 25 Hydroxy (Vit-D Deficiency, Fractures)  6. Urge incontinence Myrbetriq Rx Prior Oxybutynin SE dry mouth 6-8 weeks trial     F/U  Return in about 1 year (around 11/05/2019) for Annual.  Barnett Applebaum, MD, [facility], [facility] 11/05/2018  8:38 AM

## 2018-11-05 NOTE — Patient Instructions (Signed)
PAP every 5 years Mammogram every year    Call [phone] [facility] every 10 years after age 42 Labs today  Mirabegron extended-release tablets What is this medicine? MIRABEGRON (MIR a BEG ron) is used to treat overactive bladder. This medicine reduces the amount of bathroom visits. It may also help to control wetting accidents. It may be used alone, but sometimes may be given with other treatments. This medicine may be used for other purposes; ask your health care provider or pharmacist if you have questions. COMMON BRAND NAME(S): Myrbetriq What should I tell my health care provider before I take this medicine? They need to know if you have any of these conditions: -high blood pressure -kidney disease -liver disease -problems urinating -prostate disease -an unusual or allergic reaction to mirabegron, other medicines, foods, dyes, or preservatives -pregnant or trying to get pregnant -breast-feeding How should I use this medicine? Take this medicine by mouth with a glass of water. Follow the directions on the prescription label. Do not cut, crush or chew this medicine. You can take it with or without food. If it upsets your stomach, take it with food. Take your medicine at regular intervals. Do not take it more often than directed. Do not stop taking except on your doctor's advice. Talk to your pediatrician regarding the use of this medicine in children. Special care may be needed. Overdosage: If you think you have taken too much of this medicine contact a poison control center or emergency room at once. NOTE: This medicine is only for you. Do not share this medicine with others. What if I miss a dose? If you miss a dose, take it as soon as you can. If it is almost time for your next dose, take only that dose. Do not take double or extra doses. What may interact with this medicine? -codeine -desipramine -digoxin -flecainide -MAOIs like Carbex, Eldepryl,  Marplan, Nardil, and Parnate -methadone -metoprolol -pimozide -propafenone -thioridazine -warfarin This list may not describe all possible interactions. Give your health care provider a list of all the medicines, herbs, non-prescription drugs, or dietary supplements you use. Also tell them if you smoke, drink alcohol, or use illegal drugs. Some items may interact with your medicine. What should I watch for while using this medicine? Visit your doctor or health care professional for regular checks on your progress. Check your blood pressure as directed. Ask your doctor or health care professional what your blood pressure should be and when you should contact him or her. You may need to limit your intake of tea, coffee, caffeinated sodas, or alcohol. These drinks may make your symptoms worse. What side effects may I notice from receiving this medicine? Side effects that you should report to your doctor or health care professional as soon as possible: -allergic reactions like skin rash, itching or hives, swelling of the face, lips, or tongue -high blood pressure -fast, irregular heartbeat -redness, blistering, peeling or loosening of the skin, including inside the mouth -signs of infection like fever or chills; pain or difficulty passing urine -trouble passing urine or change in the amount of urine Side effects that usually do not require medical attention (report to your doctor or health care professional if they continue or are bothersome): -constipation -dry mouth -headache -runny nose -stomach upset This list may not describe all possible side effects. Call your doctor for medical advice about side effects. You may report side effects to FDA at 1-800-FDA-1088. Where should I keep my medicine?  Keep out of the reach of children. Store at room temperature between 15 and 30 degrees C (59 and 86 degrees F). Throw away any unused medicine after the expiration date. NOTE: This sheet is a  summary. It may not cover all possible information. If you have questions about this medicine, talk to your doctor, pharmacist, or health care provider.  2019 [employer]vier/[employer] Standard (2016-10-18 11:33:21)

## 2018-11-06 ENCOUNTER — Other Ambulatory Visit: Payer: Self-pay | Admitting: Obstetrics & Gynecology

## 2018-11-06 LAB — VITAMIN B12: Vitamin B-12: 274 pg/mL (ref 232–1245)

## 2018-11-06 LAB — LIPID PANEL
Chol/HDL Ratio: 3.6 ratio (ref 0.0–4.4)
Cholesterol, Total: 196 mg/dL (ref 100–199)
HDL: 55 mg/dL (ref >39)
LDL Calculated: 125 mg/dL — ABNORMAL HIGH (ref 0–99)
Triglycerides: 79 mg/dL (ref 0–149)
VLDL Cholesterol Cal: 16 mg/dL (ref 5–40)

## 2018-11-06 LAB — VITAMIN D 25 HYDROXY (VIT D DEFICIENCY, FRACTURES): Vit D, 25-Hydroxy: 14.9 ng/mL — ABNORMAL LOW (ref 30.0–100.0)

## 2018-11-06 LAB — GLUCOSE, FASTING: Glucose, Plasma: 70 mg/dL (ref 65–99)

## 2018-11-06 LAB — TSH: TSH: 1.36 u[IU]/mL (ref 0.450–4.500)

## 2018-11-06 MED ORDER — VITAMIN D (ERGOCALCIFEROL) 1.25 MG (50000 UNIT) PO CAPS: 50000.0000 [IU] | ORAL_CAPSULE | ORAL | 2 refills | Status: AC

## 2018-11-06 NOTE — Progress Notes (Signed)
Left voice message so pt would be aware of results

## 2018-11-06 NOTE — Progress Notes (Signed)
Let her know labs normal except low Vit D level; Rx called in

## 2019-01-25 ENCOUNTER — Other Ambulatory Visit: Payer: Self-pay | Admitting: Obstetrics & Gynecology

## 2019-03-16 ENCOUNTER — Telehealth: Payer: Self-pay

## 2019-03-16 NOTE — Telephone Encounter (Signed)
-----   Message from Robert P Harris, MD sent at 03/16/2019 11:49 AM EDT ----- Regarding: MMG Received notice she has not received MMG yet as ordered at her Annual. Please check and encourage her to do this, and document conversation.  

## 2019-03-16 NOTE — Telephone Encounter (Signed)
Left message to remind pt to schedule her mammogram. 

## 2019-05-05 ENCOUNTER — Ambulatory Visit
Admission: RE | Admit: 2019-05-05 | Discharge: 2019-05-05 | Disposition: A | Discharge status: Home / Self Care | Payer: BC Managed Care – PPO | Source: Ambulatory Visit | Attending: Obstetrics & Gynecology | Admitting: Obstetrics & Gynecology

## 2019-05-05 DIAGNOSIS — Z1231 Encounter for screening mammogram for malignant neoplasm of breast: Secondary | ICD-10-CM | POA: Diagnosis present

## 2019-05-05 DIAGNOSIS — Z1239 Encounter for other screening for malignant neoplasm of breast: Secondary | ICD-10-CM

## 2019-11-11 ENCOUNTER — Other Ambulatory Visit: Payer: Self-pay | Admitting: Obstetrics & Gynecology

## 2019-11-12 NOTE — Telephone Encounter (Signed)
Called and left voicemail for patient to call back to be scheduled. 

## 2019-11-26 ENCOUNTER — Emergency Department (HOSPITAL_COMMUNITY): Payer: BC Managed Care – PPO

## 2019-11-26 ENCOUNTER — Other Ambulatory Visit: Payer: Self-pay

## 2019-11-26 ENCOUNTER — Inpatient Hospital Stay (HOSPITAL_COMMUNITY)
Admission: AD | Admit: 2019-11-26 | Discharge: 2019-11-30 | DRG: 605 | Disposition: A | Discharge status: Home / Self Care | Payer: BC Managed Care – PPO | Attending: Surgery | Admitting: Surgery

## 2019-11-26 DIAGNOSIS — Z8249 Family history of ischemic heart disease and other diseases of the circulatory system: Secondary | ICD-10-CM

## 2019-11-26 DIAGNOSIS — R42 Dizziness and giddiness: Secondary | ICD-10-CM | POA: Diagnosis not present

## 2019-11-26 DIAGNOSIS — N6489 Other specified disorders of breast: Secondary | ICD-10-CM | POA: Diagnosis present

## 2019-11-26 DIAGNOSIS — I959 Hypotension, unspecified: Secondary | ICD-10-CM | POA: Diagnosis present

## 2019-11-26 DIAGNOSIS — F419 Anxiety disorder, unspecified: Secondary | ICD-10-CM | POA: Diagnosis present

## 2019-11-26 DIAGNOSIS — Z90710 Acquired absence of both cervix and uterus: Secondary | ICD-10-CM

## 2019-11-26 DIAGNOSIS — Z87442 Personal history of urinary calculi: Secondary | ICD-10-CM

## 2019-11-26 DIAGNOSIS — Z833 Family history of diabetes mellitus: Secondary | ICD-10-CM

## 2019-11-26 DIAGNOSIS — K219 Gastro-esophageal reflux disease without esophagitis: Secondary | ICD-10-CM | POA: Diagnosis present

## 2019-11-26 DIAGNOSIS — S301XXA Contusion of abdominal wall, initial encounter: Secondary | ICD-10-CM | POA: Diagnosis present

## 2019-11-26 DIAGNOSIS — I10 Essential (primary) hypertension: Secondary | ICD-10-CM | POA: Diagnosis present

## 2019-11-26 DIAGNOSIS — I4892 Unspecified atrial flutter: Secondary | ICD-10-CM | POA: Diagnosis present

## 2019-11-26 DIAGNOSIS — H538 Other visual disturbances: Secondary | ICD-10-CM | POA: Diagnosis present

## 2019-11-26 DIAGNOSIS — Z8041 Family history of malignant neoplasm of ovary: Secondary | ICD-10-CM

## 2019-11-26 DIAGNOSIS — Z823 Family history of stroke: Secondary | ICD-10-CM

## 2019-11-26 DIAGNOSIS — Z791 Long term (current) use of non-steroidal anti-inflammatories (NSAID): Secondary | ICD-10-CM

## 2019-11-26 DIAGNOSIS — Z20822 Contact with and (suspected) exposure to covid-19: Secondary | ICD-10-CM | POA: Diagnosis present

## 2019-11-26 DIAGNOSIS — R1084 Generalized abdominal pain: Secondary | ICD-10-CM | POA: Diagnosis not present

## 2019-11-26 DIAGNOSIS — Y92410 Unspecified street and highway as the place of occurrence of the external cause: Secondary | ICD-10-CM

## 2019-11-26 DIAGNOSIS — Z83438 Family history of other disorder of lipoprotein metabolism and other lipidemia: Secondary | ICD-10-CM

## 2019-11-26 DIAGNOSIS — Z888 Allergy status to other drugs, medicaments and biological substances status: Secondary | ICD-10-CM

## 2019-11-26 DIAGNOSIS — D62 Acute posthemorrhagic anemia: Secondary | ICD-10-CM | POA: Diagnosis present

## 2019-11-26 DIAGNOSIS — Z79899 Other long term (current) drug therapy: Secondary | ICD-10-CM

## 2019-11-26 DIAGNOSIS — Z79891 Long term (current) use of opiate analgesic: Secondary | ICD-10-CM

## 2019-11-26 DIAGNOSIS — F329 Major depressive disorder, single episode, unspecified: Secondary | ICD-10-CM | POA: Diagnosis present

## 2019-11-26 DIAGNOSIS — Z981 Arthrodesis status: Secondary | ICD-10-CM

## 2019-11-26 DIAGNOSIS — E669 Obesity, unspecified: Secondary | ICD-10-CM | POA: Diagnosis present

## 2019-11-26 DIAGNOSIS — Z803 Family history of malignant neoplasm of breast: Secondary | ICD-10-CM

## 2019-11-26 DIAGNOSIS — Z882 Allergy status to sulfonamides status: Secondary | ICD-10-CM

## 2019-11-26 DIAGNOSIS — Z6836 Body mass index (BMI) 36.0-36.9, adult: Secondary | ICD-10-CM

## 2019-11-26 DIAGNOSIS — Z9049 Acquired absence of other specified parts of digestive tract: Secondary | ICD-10-CM

## 2019-11-26 DIAGNOSIS — I340 Nonrheumatic mitral (valve) insufficiency: Secondary | ICD-10-CM | POA: Diagnosis present

## 2019-11-26 LAB — CBC WITH DIFFERENTIAL/PLATELET
Abs Immature Granulocytes: 0.1 10*3/uL — ABNORMAL HIGH (ref 0.00–0.07)
Basophils Absolute: 0.1 10*3/uL (ref 0.0–0.1)
Basophils Relative: 0 %
Eosinophils Absolute: 0.1 10*3/uL (ref 0.0–0.5)
Eosinophils Relative: 1 %
HCT: 44.9 % (ref 36.0–46.0)
Hemoglobin: 14.4 g/dL (ref 12.0–15.0)
Immature Granulocytes: 1 %
Lymphocytes Relative: 10 %
Lymphs Abs: 1.8 10*3/uL (ref 0.7–4.0)
MCH: 30.3 pg (ref 26.0–34.0)
MCHC: 32.1 g/dL (ref 30.0–36.0)
MCV: 94.3 fL (ref 80.0–100.0)
Monocytes Absolute: 1.5 10*3/uL — ABNORMAL HIGH (ref 0.1–1.0)
Monocytes Relative: 8 %
Neutro Abs: 15.3 10*3/uL — ABNORMAL HIGH (ref 1.7–7.7)
Neutrophils Relative %: 80 %
Platelets: 331 10*3/uL (ref 150–400)
RBC: 4.76 MIL/uL (ref 3.87–5.11)
RDW: 12.8 % (ref 11.5–15.5)
WBC: 18.9 10*3/uL — ABNORMAL HIGH (ref 4.0–10.5)
nRBC: 0 % (ref 0.0–0.2)

## 2019-11-26 LAB — COMPREHENSIVE METABOLIC PANEL
ALT: 25 U/L (ref 0–44)
AST: 29 U/L (ref 15–41)
Albumin: 3.7 g/dL (ref 3.5–5.0)
Alkaline Phosphatase: 48 U/L (ref 38–126)
Anion gap: 9 (ref 5–15)
BUN: 12 mg/dL (ref 6–20)
CO2: 24 mmol/L (ref 22–32)
Calcium: 9.2 mg/dL (ref 8.9–10.3)
Chloride: 103 mmol/L (ref 98–111)
Creatinine, Ser: 0.98 mg/dL (ref 0.44–1.00)
GFR calc Af Amer: >60 mL/min (ref >60)
GFR calc non Af Amer: >60 mL/min (ref >60)
Glucose, Bld: 140 mg/dL — ABNORMAL HIGH (ref 70–99)
Potassium: 4.8 mmol/L (ref 3.5–5.1)
Sodium: 136 mmol/L (ref 135–145)
Total Bilirubin: 0.8 mg/dL (ref 0.3–1.2)
Total Protein: 5.9 g/dL — ABNORMAL LOW (ref 6.5–8.1)

## 2019-11-26 LAB — I-STAT CHEM 8, ED
BUN: 15 mg/dL (ref 6–20)
Calcium, Ion: 1.13 mmol/L — ABNORMAL LOW (ref 1.15–1.40)
Chloride: 102 mmol/L (ref 98–111)
Creatinine, Ser: 0.9 mg/dL (ref 0.44–1.00)
Glucose, Bld: 140 mg/dL — ABNORMAL HIGH (ref 70–99)
HCT: 37 % (ref 36.0–46.0)
Hemoglobin: 12.6 g/dL (ref 12.0–15.0)
Potassium: 4.6 mmol/L (ref 3.5–5.1)
Sodium: 135 mmol/L (ref 135–145)
TCO2: 28 mmol/L (ref 22–32)

## 2019-11-26 LAB — URINALYSIS, ROUTINE W REFLEX MICROSCOPIC
Bilirubin Urine: NEGATIVE
Glucose, UA: 50 mg/dL — AB
Hgb urine dipstick: NEGATIVE
Ketones, ur: NEGATIVE mg/dL
Leukocytes,Ua: NEGATIVE
Nitrite: NEGATIVE
Protein, ur: NEGATIVE mg/dL
Specific Gravity, Urine: 1.027 (ref 1.005–1.030)
pH: 5 (ref 5.0–8.0)

## 2019-11-26 LAB — CBC
HCT: 41.5 % (ref 36.0–46.0)
Hemoglobin: 13.5 g/dL (ref 12.0–15.0)
MCH: 30.6 pg (ref 26.0–34.0)
MCHC: 32.5 g/dL (ref 30.0–36.0)
MCV: 94.1 fL (ref 80.0–100.0)
Platelets: 8 10*3/uL — CL (ref 150–400)
RBC: 4.41 MIL/uL (ref 3.87–5.11)
RDW: 12.7 % (ref 11.5–15.5)
WBC: 12.5 10*3/uL — ABNORMAL HIGH (ref 4.0–10.5)
nRBC: 0 % (ref 0.0–0.2)

## 2019-11-26 LAB — SAMPLE TO BLOOD BANK

## 2019-11-26 LAB — PROTIME-INR
INR: 1.1 (ref 0.8–1.2)
Prothrombin Time: 13.3 seconds (ref 11.4–15.2)

## 2019-11-26 LAB — LACTIC ACID, PLASMA: Lactic Acid, Venous: 1.4 mmol/L (ref 0.5–1.9)

## 2019-11-26 LAB — I-STAT BETA HCG BLOOD, ED (MC, WL, AP ONLY): I-stat hCG, quantitative: <5 m[IU]/mL (ref <5)

## 2019-11-26 LAB — ETHANOL: Alcohol, Ethyl (B): <10 mg/dL (ref <10)

## 2019-11-26 MED ORDER — MORPHINE SULFATE (PF) 4 MG/ML IV SOLN: 4.0000 mg | INTRAVENOUS | Status: DC | PRN

## 2019-11-26 MED ORDER — MORPHINE SULFATE (PF) 4 MG/ML IV SOLN
4.0000 mg | Freq: Once | INTRAVENOUS | Status: AC
  Administered 2019-11-26: 4 mg via INTRAVENOUS
  Filled 2019-11-26: qty 1

## 2019-11-26 MED ORDER — ONDANSETRON HCL 4 MG/2ML IJ SOLN
4.0000 mg | Freq: Once | INTRAMUSCULAR | Status: AC
  Administered 2019-11-26: 4 mg via INTRAVENOUS
  Filled 2019-11-26: qty 2

## 2019-11-26 MED ORDER — BUPROPION HCL ER (XL) 300 MG PO TB24
300.0000 mg | ORAL_TABLET | ORAL | Status: DC
  Administered 2019-11-27 – 2019-11-30 (×4): 300 mg via ORAL
  Filled 2019-11-26: qty 2
  Filled 2019-11-26 (×3): qty 1

## 2019-11-26 MED ORDER — ALPRAZOLAM 0.25 MG PO TABS
0.2500 mg | ORAL_TABLET | Freq: Two times a day (BID) | ORAL | Status: DC | PRN
  Administered 2019-11-28 – 2019-11-29 (×2): 0.25 mg via ORAL
  Filled 2019-11-26 (×2): qty 1

## 2019-11-26 MED ORDER — SODIUM CHLORIDE 0.9 % IV BOLUS
500.0000 mL | Freq: Once | INTRAVENOUS | Status: AC
Start: 2019-11-26 — End: 2019-11-26
  Administered 2019-11-26: 500 mL via INTRAVENOUS

## 2019-11-26 MED ORDER — PANTOPRAZOLE SODIUM 40 MG PO TBEC
40.0000 mg | DELAYED_RELEASE_TABLET | Freq: Two times a day (BID) | ORAL | Status: DC
  Administered 2019-11-27 – 2019-11-30 (×8): 40 mg via ORAL
  Filled 2019-11-26 (×8): qty 1

## 2019-11-26 MED ORDER — MECLIZINE HCL 25 MG PO TABS
25.0000 mg | ORAL_TABLET | Freq: Three times a day (TID) | ORAL | Status: DC | PRN
  Administered 2019-11-27: 25 mg via ORAL
  Filled 2019-11-26 (×2): qty 1

## 2019-11-26 MED ORDER — SODIUM CHLORIDE 0.9 % IV BOLUS
1000.0000 mL | Freq: Once | INTRAVENOUS | Status: AC
  Administered 2019-11-26: 1000 mL via INTRAVENOUS

## 2019-11-26 MED ORDER — LACTATED RINGERS IV SOLN: INTRAVENOUS | Status: DC

## 2019-11-26 MED ORDER — FENTANYL CITRATE (PF) 100 MCG/2ML IJ SOLN
50.0000 ug | Freq: Once | INTRAMUSCULAR | Status: AC
Start: 2019-11-26 — End: 2019-11-26
  Administered 2019-11-26: 50 ug via INTRAVENOUS
  Filled 2019-11-26: qty 2

## 2019-11-26 MED ORDER — METHOCARBAMOL 750 MG PO TABS
750.0000 mg | ORAL_TABLET | Freq: Three times a day (TID) | ORAL | Status: DC
  Administered 2019-11-27 (×2): 750 mg via ORAL
  Filled 2019-11-26 (×2): qty 1

## 2019-11-26 MED ORDER — MIRABEGRON ER 25 MG PO TB24
25.0000 mg | ORAL_TABLET | Freq: Every day | ORAL | Status: DC
  Administered 2019-11-27 – 2019-11-30 (×2): 25 mg via ORAL
  Filled 2019-11-26 (×4): qty 1

## 2019-11-26 MED ORDER — ALBUMIN HUMAN 5 % IV SOLN
25.0000 g | Freq: Once | INTRAVENOUS | Status: AC
  Administered 2019-11-27: 25 g via INTRAVENOUS
  Filled 2019-11-26: qty 500

## 2019-11-26 MED ORDER — ONDANSETRON 4 MG PO TBDP
8.0000 mg | ORAL_TABLET | Freq: Three times a day (TID) | ORAL | Status: DC | PRN
  Administered 2019-11-27 – 2019-11-30 (×6): 8 mg via ORAL
  Filled 2019-11-26 (×6): qty 2

## 2019-11-26 MED ORDER — IOHEXOL 300 MG/ML  SOLN
100.0000 mL | Freq: Once | INTRAMUSCULAR | Status: AC | PRN
  Administered 2019-11-26: 100 mL via INTRAVENOUS

## 2019-11-26 MED ORDER — METOPROLOL TARTRATE 50 MG PO TABS: 50.0000 mg | ORAL_TABLET | Freq: Two times a day (BID) | ORAL | Status: DC

## 2019-11-26 MED ORDER — IBUPROFEN 800 MG PO TABS
800.0000 mg | ORAL_TABLET | Freq: Four times a day (QID) | ORAL | Status: DC | PRN
  Administered 2019-11-27: 800 mg via ORAL
  Filled 2019-11-26: qty 1

## 2019-11-26 MED ORDER — OXYCODONE HCL 5 MG PO TABS
5.0000 mg | ORAL_TABLET | ORAL | Status: DC | PRN
  Administered 2019-11-28 – 2019-11-30 (×5): 5 mg via ORAL
  Filled 2019-11-26 (×5): qty 1

## 2019-11-26 MED ORDER — FENTANYL CITRATE (PF) 100 MCG/2ML IJ SOLN
50.0000 ug | Freq: Once | INTRAMUSCULAR | Status: AC
  Administered 2019-11-26: 50 ug via INTRAVENOUS
  Filled 2019-11-26: qty 2

## 2019-11-26 MED ORDER — DULOXETINE HCL 60 MG PO CPEP
90.0000 mg | ORAL_CAPSULE | Freq: Every day | ORAL | Status: DC
  Administered 2019-11-27 – 2019-11-30 (×4): 90 mg via ORAL
  Filled 2019-11-26 (×4): qty 1

## 2019-11-26 MED ORDER — OXYCODONE HCL 5 MG PO TABS
10.0000 mg | ORAL_TABLET | ORAL | Status: DC | PRN
  Administered 2019-11-27 – 2019-11-29 (×5): 10 mg via ORAL
  Filled 2019-11-26 (×5): qty 2
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
## 2019-11-26 NOTE — Consult Note (Signed)
Responded to call that pt's family member was awaiting her return from [place]. Provided spiritual care and prayed with pt's boyfriend Jaynie Collins, who was appreciative of comfort. They are [place]. Let him know that they may ask a nurse at any time if they have further need of chaplain services  Rev. Donnel Saxon Chaplain
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
## 2019-11-26 NOTE — ED Notes (Signed)
C-collar placed.

## 2019-11-27 ENCOUNTER — Observation Stay (HOSPITAL_COMMUNITY): Payer: BC Managed Care – PPO

## 2019-11-27 LAB — BASIC METABOLIC PANEL
Anion gap: 7 (ref 5–15)
BUN: 9 mg/dL (ref 6–20)
CO2: 27 mmol/L (ref 22–32)
Calcium: 8.8 mg/dL — ABNORMAL LOW (ref 8.9–10.3)
Chloride: 102 mmol/L (ref 98–111)
Creatinine, Ser: 0.86 mg/dL (ref 0.44–1.00)
GFR calc Af Amer: >60 mL/min (ref >60)
GFR calc non Af Amer: >60 mL/min (ref >60)
Glucose, Bld: 163 mg/dL — ABNORMAL HIGH (ref 70–99)
Potassium: 4.2 mmol/L (ref 3.5–5.1)
Sodium: 136 mmol/L (ref 135–145)

## 2019-11-27 LAB — CBC
HCT: 38 % (ref 36.0–46.0)
HCT: 30.1 % — ABNORMAL LOW (ref 36.0–46.0)
HCT: 33.9 % — ABNORMAL LOW (ref 36.0–46.0)
Hemoglobin: 12.4 g/dL (ref 12.0–15.0)
Hemoglobin: 9.8 g/dL — ABNORMAL LOW (ref 12.0–15.0)
Hemoglobin: 11.1 g/dL — ABNORMAL LOW (ref 12.0–15.0)
MCH: 30.5 pg (ref 26.0–34.0)
MCH: 30.5 pg (ref 26.0–34.0)
MCH: 30.5 pg (ref 26.0–34.0)
MCHC: 32.6 g/dL (ref 30.0–36.0)
MCHC: 32.6 g/dL (ref 30.0–36.0)
MCHC: 32.7 g/dL (ref 30.0–36.0)
MCV: 93.4 fL (ref 80.0–100.0)
MCV: 93.8 fL (ref 80.0–100.0)
MCV: 93.1 fL (ref 80.0–100.0)
Platelets: 260 10*3/uL (ref 150–400)
Platelets: 229 10*3/uL (ref 150–400)
Platelets: 223 10*3/uL (ref 150–400)
RBC: 4.07 MIL/uL (ref 3.87–5.11)
RBC: 3.21 MIL/uL — ABNORMAL LOW (ref 3.87–5.11)
RBC: 3.64 MIL/uL — ABNORMAL LOW (ref 3.87–5.11)
RDW: 13.2 % (ref 11.5–15.5)
RDW: 13.2 % (ref 11.5–15.5)
RDW: 13.2 % (ref 11.5–15.5)
WBC: 10.8 10*3/uL — ABNORMAL HIGH (ref 4.0–10.5)
WBC: 7.3 10*3/uL (ref 4.0–10.5)
WBC: 6.4 10*3/uL (ref 4.0–10.5)
nRBC: 0 % (ref 0.0–0.2)
nRBC: 0 % (ref 0.0–0.2)
nRBC: 0 % (ref 0.0–0.2)

## 2019-11-27 LAB — URINALYSIS, ROUTINE W REFLEX MICROSCOPIC
Bilirubin Urine: NEGATIVE
Glucose, UA: NEGATIVE mg/dL
Hgb urine dipstick: NEGATIVE
Ketones, ur: NEGATIVE mg/dL
Leukocytes,Ua: NEGATIVE
Nitrite: NEGATIVE
Protein, ur: NEGATIVE mg/dL
Specific Gravity, Urine: 1.042 — ABNORMAL HIGH (ref 1.005–1.030)
pH: 5 (ref 5.0–8.0)

## 2019-11-27 LAB — PREPARE RBC (CROSSMATCH)

## 2019-11-27 LAB — SARS CORONAVIRUS 2 BY RT PCR (HOSPITAL ORDER, PERFORMED IN ~~LOC~~ HOSPITAL LAB): SARS Coronavirus 2: NEGATIVE

## 2019-11-27 LAB — HIV ANTIBODY (ROUTINE TESTING W REFLEX): HIV Screen 4th Generation wRfx: NONREACTIVE

## 2019-11-27 MED ORDER — LACTATED RINGERS IV BOLUS
1000.0000 mL | Freq: Once | INTRAVENOUS | Status: AC
  Administered 2019-11-27: 1000 mL via INTRAVENOUS

## 2019-11-27 MED ORDER — IBUPROFEN 800 MG PO TABS: 800.0000 mg | ORAL_TABLET | Freq: Three times a day (TID) | ORAL | Status: DC

## 2019-11-27 MED ORDER — GLUCAGON HCL RDNA (DIAGNOSTIC) 1 MG IJ SOLR: 5.0000 mg | Freq: Once | INTRAVENOUS | Status: DC

## 2019-11-27 MED ORDER — METHOCARBAMOL 500 MG PO TABS
1000.0000 mg | ORAL_TABLET | Freq: Three times a day (TID) | ORAL | Status: DC
  Administered 2019-11-27 – 2019-11-28 (×3): 1000 mg via ORAL
  Filled 2019-11-27 (×3): qty 2

## 2019-11-27 MED ORDER — IOHEXOL 300 MG/ML  SOLN
100.0000 mL | Freq: Once | INTRAMUSCULAR | Status: AC | PRN
  Administered 2019-11-27: 100 mL via INTRAVENOUS

## 2019-11-27 MED ORDER — ACETAMINOPHEN 500 MG PO TABS
1000.0000 mg | ORAL_TABLET | Freq: Four times a day (QID) | ORAL | Status: DC
  Administered 2019-11-27 – 2019-11-30 (×12): 1000 mg via ORAL
  Filled 2019-11-27 (×13): qty 2

## 2019-11-27 MED ORDER — CALCIUM GLUCONATE-NACL 2-0.675 GM/100ML-% IV SOLN
2.0000 g | Freq: Once | INTRAVENOUS | Status: AC
  Administered 2019-11-27: 2000 mg via INTRAVENOUS
  Filled 2019-11-27: qty 100

## 2019-11-27 MED ORDER — KETOROLAC TROMETHAMINE 10 MG PO TABS
10.0000 mg | ORAL_TABLET | Freq: Four times a day (QID) | ORAL | Status: DC
  Administered 2019-11-28 – 2019-11-30 (×9): 10 mg via ORAL
  Filled 2019-11-27 (×15): qty 1

## 2019-11-27 MED ORDER — SODIUM CHLORIDE 0.9% IV SOLUTION: Freq: Once | INTRAVENOUS | Status: AC

## 2019-11-27 MED ORDER — FENTANYL CITRATE (PF) 100 MCG/2ML IJ SOLN
50.0000 ug | INTRAMUSCULAR | Status: AC | PRN
  Administered 2019-11-27 – 2019-11-28 (×2): 50 ug via INTRAVENOUS
  Filled 2019-11-27 (×2): qty 2

## 2019-11-27 MED ORDER — LIDOCAINE 5 % EX PTCH
1.0000 | MEDICATED_PATCH | CUTANEOUS | Status: DC
  Administered 2019-11-27 – 2019-11-30 (×4): 1 via TRANSDERMAL
  Filled 2019-11-27 (×4): qty 1

## 2019-11-27 MED ORDER — METOPROLOL TARTRATE 50 MG PO TABS
50.0000 mg | ORAL_TABLET | Freq: Two times a day (BID) | ORAL | Status: DC
  Administered 2019-11-27: 50 mg via ORAL
  Filled 2019-11-27: qty 1

## 2019-11-27 NOTE — Progress Notes (Signed)
Received patient from ED, AOx4, VSS with soft BP, pain at 8/10 while using BSC to void, oriented to room, bed controls, call light and plan of care.  Administered PRN Oxycodone 10mg  for pain and Zofran 8mg  for nausea per order.  Placed patient on telemetry and continuous pulse oximeter monitors.  Will monitor.

## 2019-11-27 NOTE — Progress Notes (Signed)
Report called to Larita Fife, RN on 4E.   Patient transferred to 32E08. Patient's mother at bedside.
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
## 2019-11-27 NOTE — Progress Notes (Signed)
Paged Cynthia Solis, [name]- Patient continues to complain of dizziness when lying still in bed and continuous low BP 89/49.   [phone] Cynthia Ivan, PA at bedside to assess patient. Abdominal US done at bedside. New orders placed for CT and transfusion.

## 2019-11-27 NOTE — Progress Notes (Addendum)
Paged by RN that patient hypotensive 80's/40's and having dizziness at rest. I went to the bedside to evaluate the patient. She took her PO metoprolol this AM, HR 68 bpm  Patient complains of increased abdominal pain and dull headache. She reports some heart palpitations when she stood up to use the bedside commode that resolved with rest. Continues to have dizziness/light-headedness at rest. Denies CP. She is voiding (2x since this morning) with some hesitancy.  Repeat CBC this afternoon hgb 9.8 from 12.4   On exam her abdomen is globally tender to light palpation (increased pain compared to my exam this AM), worse in lower abdomen. Her abdominal wall hematomas appear stable and are soft, not tense.   Bedside FAST performed by me was negative for free fluid in the RUQ, LUQ, Epigastrium, and pelvis. Dr. Bedelia Person to come perform FAST as well.   - type and screen ordered  - CT abdomen/pelvis ordered  - will order H/H q6h    Hosie Spangle, PA-C

## 2019-11-27 NOTE — Progress Notes (Signed)
Pt arrived to rm 4E08 from 6 north. Pt alert. Initiated tele. VSS. Call bell within reach. CHG and assessment performed. Went over visitor policy with pt and her mother. Pt and the mother verbalized understanding .   Lawson Radar, RN
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
## 2019-11-28 ENCOUNTER — Observation Stay (HOSPITAL_COMMUNITY): Payer: BC Managed Care – PPO

## 2019-11-28 DIAGNOSIS — Z888 Allergy status to other drugs, medicaments and biological substances status: Secondary | ICD-10-CM | POA: Diagnosis not present

## 2019-11-28 DIAGNOSIS — I340 Nonrheumatic mitral (valve) insufficiency: Secondary | ICD-10-CM | POA: Diagnosis present

## 2019-11-28 DIAGNOSIS — N6489 Other specified disorders of breast: Secondary | ICD-10-CM | POA: Diagnosis present

## 2019-11-28 DIAGNOSIS — F329 Major depressive disorder, single episode, unspecified: Secondary | ICD-10-CM | POA: Diagnosis present

## 2019-11-28 DIAGNOSIS — Z8249 Family history of ischemic heart disease and other diseases of the circulatory system: Secondary | ICD-10-CM | POA: Diagnosis not present

## 2019-11-28 DIAGNOSIS — R1084 Generalized abdominal pain: Secondary | ICD-10-CM | POA: Diagnosis present

## 2019-11-28 DIAGNOSIS — I959 Hypotension, unspecified: Secondary | ICD-10-CM | POA: Diagnosis present

## 2019-11-28 DIAGNOSIS — Z803 Family history of malignant neoplasm of breast: Secondary | ICD-10-CM | POA: Diagnosis not present

## 2019-11-28 DIAGNOSIS — Z882 Allergy status to sulfonamides status: Secondary | ICD-10-CM | POA: Diagnosis not present

## 2019-11-28 DIAGNOSIS — Z90710 Acquired absence of both cervix and uterus: Secondary | ICD-10-CM | POA: Diagnosis not present

## 2019-11-28 DIAGNOSIS — Z20822 Contact with and (suspected) exposure to covid-19: Secondary | ICD-10-CM | POA: Diagnosis present

## 2019-11-28 DIAGNOSIS — Z823 Family history of stroke: Secondary | ICD-10-CM | POA: Diagnosis not present

## 2019-11-28 DIAGNOSIS — Z981 Arthrodesis status: Secondary | ICD-10-CM | POA: Diagnosis not present

## 2019-11-28 DIAGNOSIS — K219 Gastro-esophageal reflux disease without esophagitis: Secondary | ICD-10-CM | POA: Diagnosis present

## 2019-11-28 DIAGNOSIS — D62 Acute posthemorrhagic anemia: Secondary | ICD-10-CM | POA: Diagnosis present

## 2019-11-28 DIAGNOSIS — Z83438 Family history of other disorder of lipoprotein metabolism and other lipidemia: Secondary | ICD-10-CM | POA: Diagnosis not present

## 2019-11-28 DIAGNOSIS — Z833 Family history of diabetes mellitus: Secondary | ICD-10-CM | POA: Diagnosis not present

## 2019-11-28 DIAGNOSIS — Y92410 Unspecified street and highway as the place of occurrence of the external cause: Secondary | ICD-10-CM | POA: Diagnosis not present

## 2019-11-28 DIAGNOSIS — I4892 Unspecified atrial flutter: Secondary | ICD-10-CM | POA: Diagnosis present

## 2019-11-28 DIAGNOSIS — E669 Obesity, unspecified: Secondary | ICD-10-CM | POA: Diagnosis present

## 2019-11-28 DIAGNOSIS — Z6836 Body mass index (BMI) 36.0-36.9, adult: Secondary | ICD-10-CM | POA: Diagnosis not present

## 2019-11-28 DIAGNOSIS — S301XXA Contusion of abdominal wall, initial encounter: Secondary | ICD-10-CM | POA: Diagnosis present

## 2019-11-28 DIAGNOSIS — Z8041 Family history of malignant neoplasm of ovary: Secondary | ICD-10-CM | POA: Diagnosis not present

## 2019-11-28 DIAGNOSIS — F419 Anxiety disorder, unspecified: Secondary | ICD-10-CM | POA: Diagnosis present

## 2019-11-28 DIAGNOSIS — Z87442 Personal history of urinary calculi: Secondary | ICD-10-CM | POA: Diagnosis not present

## 2019-11-28 DIAGNOSIS — Z9049 Acquired absence of other specified parts of digestive tract: Secondary | ICD-10-CM | POA: Diagnosis not present

## 2019-11-28 LAB — TYPE AND SCREEN
ABO/RH(D): O POS
Antibody Screen: NEGATIVE
Unit division: 0

## 2019-11-28 LAB — CBC
HCT: 34.2 % — ABNORMAL LOW (ref 36.0–46.0)
HCT: 35.5 % — ABNORMAL LOW (ref 36.0–46.0)
Hemoglobin: 11.3 g/dL — ABNORMAL LOW (ref 12.0–15.0)
Hemoglobin: 11.5 g/dL — ABNORMAL LOW (ref 12.0–15.0)
MCH: 31.1 pg (ref 26.0–34.0)
MCH: 30.3 pg (ref 26.0–34.0)
MCHC: 33 g/dL (ref 30.0–36.0)
MCHC: 32.4 g/dL (ref 30.0–36.0)
MCV: 94.2 fL (ref 80.0–100.0)
MCV: 93.7 fL (ref 80.0–100.0)
Platelets: 233 10*3/uL (ref 150–400)
Platelets: 227 10*3/uL (ref 150–400)
RBC: 3.63 MIL/uL — ABNORMAL LOW (ref 3.87–5.11)
RBC: 3.79 MIL/uL — ABNORMAL LOW (ref 3.87–5.11)
RDW: 13.2 % (ref 11.5–15.5)
RDW: 13.3 % (ref 11.5–15.5)
WBC: 6.7 10*3/uL (ref 4.0–10.5)
WBC: 6.5 10*3/uL (ref 4.0–10.5)
nRBC: 0 % (ref 0.0–0.2)
nRBC: 0 % (ref 0.0–0.2)

## 2019-11-28 LAB — BPAM RBC
Blood Product Expiration Date: 202107162359
ISSUE DATE / TIME: 202106181652
Unit Type and Rh: 5100

## 2019-11-28 MED ORDER — METHOCARBAMOL 500 MG PO TABS
1000.0000 mg | ORAL_TABLET | Freq: Four times a day (QID) | ORAL | Status: DC
  Administered 2019-11-28 – 2019-11-30 (×8): 1000 mg via ORAL
  Filled 2019-11-28 (×8): qty 2

## 2019-11-28 MED ORDER — IOHEXOL 350 MG/ML SOLN
75.0000 mL | Freq: Once | INTRAVENOUS | Status: AC | PRN
  Administered 2019-11-28: 75 mL via INTRAVENOUS
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
## 2019-11-28 NOTE — Progress Notes (Signed)
Patient gets up to the [facility] and her heart rate gets to the 140s, on assessment, pt expresses that she was anxious about her metoprolol being held d/t her low BP. HR is now in the 90s. But pt is still anxious about not having her metoprolol. We'll continue to monitor.
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
## 2019-11-29 LAB — BASIC METABOLIC PANEL
Anion gap: 7 (ref 5–15)
BUN: 9 mg/dL (ref 6–20)
CO2: 25 mmol/L (ref 22–32)
Calcium: 8.3 mg/dL — ABNORMAL LOW (ref 8.9–10.3)
Chloride: 106 mmol/L (ref 98–111)
Creatinine, Ser: 0.78 mg/dL (ref 0.44–1.00)
GFR calc Af Amer: >60 mL/min (ref >60)
GFR calc non Af Amer: >60 mL/min (ref >60)
Glucose, Bld: 82 mg/dL (ref 70–99)
Potassium: 3.3 mmol/L — ABNORMAL LOW (ref 3.5–5.1)
Sodium: 138 mmol/L (ref 135–145)

## 2019-11-29 MED ORDER — METOPROLOL TARTRATE 50 MG PO TABS
50.0000 mg | ORAL_TABLET | Freq: Two times a day (BID) | ORAL | Status: DC
  Administered 2019-11-29 – 2019-11-30 (×3): 50 mg via ORAL
  Filled 2019-11-29 (×3): qty 1
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
## 2019-11-30 ENCOUNTER — Encounter (HOSPITAL_COMMUNITY): Payer: Self-pay | Admitting: Vascular & Interventional Radiology

## 2019-11-30 MED ORDER — POTASSIUM CHLORIDE 20 MEQ/15ML (10%) PO SOLN
40.0000 meq | Freq: Once | ORAL | Status: AC
  Administered 2019-11-30: 40 meq via ORAL
  Filled 2019-11-30: qty 30

## 2019-11-30 MED ORDER — OXYCODONE HCL 5 MG PO TABS: 5.0000 mg | ORAL_TABLET | Freq: Four times a day (QID) | ORAL | 0 refills | Status: AC | PRN

## 2019-11-30 MED ORDER — ACETAMINOPHEN 500 MG PO TABS: 1000.0000 mg | ORAL_TABLET | Freq: Four times a day (QID) | ORAL | 0 refills | Status: AC | PRN

## 2019-11-30 MED ORDER — LIDOCAINE 5 % EX PTCH: 1.0000 | MEDICATED_PATCH | CUTANEOUS | 0 refills | Status: AC

## 2019-11-30 MED ORDER — METHOCARBAMOL 500 MG PO TABS: 1000.0000 mg | ORAL_TABLET | Freq: Four times a day (QID) | ORAL | 0 refills | Status: AC | PRN
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
## 2019-11-30 NOTE — TOC CAGE-AID Note (Signed)
Transition of Care [facility]) - CAGE-AID Screening   Patient Details  Name: [name] MRN: [id number] Date of Birth: [birth year]  Transition of Care [facility]) CM/SW Contact:    Jimmy Picket, [place] Phone Number: 11/30/2019, 12:47 PM   Clinical Narrative:  PT reports she socially drinks alcohol, about 1x a month. Pt denies substance use.   CAGE-AID Screening:    Have You Ever Felt You Ought to Cut Down on Your Drinking or Drug Use?: No Have People Annoyed You By Critizing Your Drinking Or Drug Use?: No Have You Felt Bad Or Guilty About Your Drinking Or Drug Use?: No Have You Ever Had a Drink or Used Drugs First Thing In The Morning to STeady Your Nerves or to Get Rid of a Hangover?: No CAGE-AID Score: 0  Substance Abuse Education Offered: Yes  Substance abuse interventions: Patient Counseling  Jimmy Picket, Theresia Majors, [place] Clinical Social Worker [phone]
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
## 2019-12-07 ENCOUNTER — Other Ambulatory Visit: Payer: Self-pay | Admitting: Obstetrics & Gynecology

## 2019-12-07 NOTE — Telephone Encounter (Signed)
Patient is scheduled on 12/25/19 for annual

## 2019-12-07 NOTE — Telephone Encounter (Signed)
Sch Annual.  Rx refill x1

## 2019-12-25 ENCOUNTER — Ambulatory Visit (INDEPENDENT_AMBULATORY_CARE_PROVIDER_SITE_OTHER): Payer: BC Managed Care – PPO | Admitting: Obstetrics & Gynecology

## 2019-12-25 ENCOUNTER — Other Ambulatory Visit: Payer: Self-pay

## 2019-12-25 ENCOUNTER — Encounter: Payer: Self-pay | Admitting: Obstetrics & Gynecology

## 2019-12-25 VITALS — BP 120/80 | Ht 62.0 in | Wt 209.0 lb

## 2019-12-25 DIAGNOSIS — E559 Vitamin D deficiency, unspecified: Secondary | ICD-10-CM | POA: Diagnosis not present

## 2019-12-25 DIAGNOSIS — Z1231 Encounter for screening mammogram for malignant neoplasm of breast: Secondary | ICD-10-CM

## 2019-12-25 DIAGNOSIS — N3941 Urge incontinence: Secondary | ICD-10-CM | POA: Diagnosis not present

## 2019-12-25 DIAGNOSIS — D6489 Other specified anemias: Secondary | ICD-10-CM

## 2019-12-25 DIAGNOSIS — Z01419 Encounter for gynecological examination (general) (routine) without abnormal findings: Secondary | ICD-10-CM | POA: Diagnosis not present

## 2019-12-25 MED ORDER — MIRABEGRON ER 25 MG PO TB24: 25.0000 mg | ORAL_TABLET | Freq: Every day | ORAL | 3 refills | Status: AC
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
## 2019-12-25 NOTE — Patient Instructions (Addendum)
Mammogram every year    Call [phone] to schedule at [facility] in Nov Labs - recheck VITAMIN D, CBC  Thank you for choosing [facility]. As part of our ongoing efforts to improve patient experience, we would appreciate your feedback. Please fill out the short survey that you will receive by mail or MyChart. Your opinion is important to [place]!

## 2019-12-26 LAB — CBC
Hematocrit: 42.8 % (ref 34.0–46.6)
Hemoglobin: 14.1 g/dL (ref 11.1–15.9)
MCH: 30.9 pg (ref 26.6–33.0)
MCHC: 32.9 g/dL (ref 31.5–35.7)
MCV: 94 fL (ref 79–97)
Platelets: 377 10*3/uL (ref 150–450)
RBC: 4.57 x10E6/uL (ref 3.77–5.28)
RDW: 12.7 % (ref 11.7–15.4)
WBC: 8.8 10*3/uL (ref 3.4–10.8)

## 2019-12-26 LAB — VITAMIN D 25 HYDROXY (VIT D DEFICIENCY, FRACTURES): Vit D, 25-Hydroxy: 38.5 ng/mL (ref 30.0–100.0)

## 2020-04-12 ENCOUNTER — Other Ambulatory Visit: Payer: Self-pay | Admitting: Family Medicine

## 2020-04-12 DIAGNOSIS — R109 Unspecified abdominal pain: Secondary | ICD-10-CM

## 2020-04-12 DIAGNOSIS — Z87828 Personal history of other (healed) physical injury and trauma: Secondary | ICD-10-CM

## 2020-04-14 ENCOUNTER — Other Ambulatory Visit: Payer: Self-pay

## 2020-04-14 ENCOUNTER — Ambulatory Visit
Admission: RE | Admit: 2020-04-14 | Discharge: 2020-04-14 | Disposition: A | Discharge status: Home / Self Care | Payer: Self-pay | Source: Ambulatory Visit | Attending: Family Medicine | Admitting: Family Medicine

## 2020-04-14 DIAGNOSIS — Z87828 Personal history of other (healed) physical injury and trauma: Secondary | ICD-10-CM | POA: Insufficient documentation

## 2020-04-14 DIAGNOSIS — R109 Unspecified abdominal pain: Secondary | ICD-10-CM | POA: Insufficient documentation

## 2020-04-14 MED ORDER — IOHEXOL 300 MG/ML  SOLN
100.0000 mL | Freq: Once | INTRAMUSCULAR | Status: AC | PRN
  Administered 2020-04-14: 100 mL via INTRAVENOUS

## 2020-06-29 ENCOUNTER — Telehealth: Payer: Self-pay

## 2020-06-29 NOTE — Telephone Encounter (Signed)
Left message to remind pt to schedule her mammogram. 

## 2020-06-29 NOTE — Telephone Encounter (Signed)
-----   Message from Nadara Mustard, MD sent at 06/21/2020  8:53 AM EST ----- Regarding: MMG Received notice she has not received MMG yet as ordered at her Annual. Please check and encourage her to do this, and document conversation.

## 2020-09-05 ENCOUNTER — Other Ambulatory Visit: Payer: Self-pay | Admitting: Obstetrics & Gynecology

## 2020-09-05 DIAGNOSIS — Z1231 Encounter for screening mammogram for malignant neoplasm of breast: Secondary | ICD-10-CM

## 2020-12-08 ENCOUNTER — Other Ambulatory Visit (HOSPITAL_COMMUNITY)
Admission: RE | Admit: 2020-12-08 | Discharge: 2020-12-08 | Disposition: A | Discharge status: Home / Self Care | Payer: Self-pay | Source: Ambulatory Visit | Attending: Obstetrics and Gynecology | Admitting: Obstetrics and Gynecology

## 2020-12-08 ENCOUNTER — Encounter: Payer: Self-pay | Admitting: Obstetrics and Gynecology

## 2020-12-08 ENCOUNTER — Ambulatory Visit (INDEPENDENT_AMBULATORY_CARE_PROVIDER_SITE_OTHER): Payer: Self-pay | Admitting: Obstetrics and Gynecology

## 2020-12-08 ENCOUNTER — Other Ambulatory Visit: Payer: Self-pay

## 2020-12-08 VITALS — BP 112/70 | Ht 62.0 in | Wt 212.0 lb

## 2020-12-08 DIAGNOSIS — Z113 Encounter for screening for infections with a predominantly sexual mode of transmission: Secondary | ICD-10-CM

## 2020-12-08 DIAGNOSIS — Z202 Contact with and (suspected) exposure to infections with a predominantly sexual mode of transmission: Secondary | ICD-10-CM

## 2020-12-08 NOTE — Progress Notes (Signed)
Cynthia Pitch, MD   Chief Complaint  Patient presents with   sti testing    HPI:      Cynthia Solis is a 45 y.o. [id number] whose LMP was Patient's last menstrual period was 01/18/2017 (approximate)., presents today for STD testing. No known exposures but previous partner was unfaithful. No vag sx, no pelvic pain, fevers, LBP. Hx of chlamydia in past; hx of cold sores, no genital HSV. She is s/p hyst, no PMB.   Past Medical History:  Diagnosis Date   Anxiety    Atrial flutter ([place])    BRCA gene mutation negative in female 06/22/2016   3/16 pt MyRisk neg; sister is RAD51C pos; mom is BRCA neg.   Cold sore    COVID-19    Depression    Dysrhythmia    H/O ATRIAL FLUTTER-PT STATES METOPROLOL DOSE HAS BEEN INCREASED AND PALPITATIONS ARE NOW UNDER CONTROL   Family history of breast cancer 08/2014   IBIS = 18%   Family history of ovarian cancer 03/28/2015   PT'S SISTER IS RAD51C POS, PT IS NEG.   Fracture of lumbar spine (HCC)    GERD (gastroesophageal reflux disease)    Heart murmur    since a child   History of abnormal mammogram 06/27/2014   NEG   History of chicken pox    History of kidney stones    History of Papanicolaou smear of cervix 04/20/2014   NEG CT NEG   Mitral regurgitation    Obesity    Pelvic pain     Past Surgical History:  Procedure Laterality Date   ABDOMINAL HYSTERECTOMY  2018   ABLATION     BACK SURGERY  2008   lumbar 4-5 fusion, titanium   CHOLECYSTECTOMY N/A 04/19/2016   COLONOSCOPY WITH PROPOFOL N/A 07/14/2015   Procedure: COLONOSCOPY WITH PROPOFOL;  Surgeon: Hulen Luster, MD;  Location: [facility] ENDOSCOPY;  Service: Gastroenterology;  Laterality: N/A;   DIAGNOSTIC LAPAROSCOPY  2005   DILATION AND CURETTAGE OF UTERUS  2000   ELBOW SURGERY     right elbow tendon repair   ENDOMETRIAL ABLATION     ESOPHAGOGASTRODUODENOSCOPY N/A 07/14/2015   Procedure: ESOPHAGOGASTRODUODENOSCOPY (EGD);  Surgeon: Hulen Luster, MD;  Location: [facility] ENDOSCOPY;  Service:  Gastroenterology;  Laterality: N/A;   INDUCED ABORTION     X2   LAPAROSCOPIC HYSTERECTOMY N/A 09/24/2017   Procedure: HYSTERECTOMY TOTAL LAPAROSCOPIC;  Surgeon: Gae Dry, MD;  Location: [facility] ORS;  Service: Gynecology;  Laterality: N/A;   LAPAROSCOPIC OVARIAN CYSTECTOMY Right 08/11/2015   Procedure: LAPAROSCOPIC OVARIAN CYSTECTOMY;  Surgeon: Gae Dry, MD;  Location: [facility] ORS;  Service: Gynecology;  Laterality: Right;   LAPAROSCOPIC SALPINGO OOPHERECTOMY Left 04/05/2015   Procedure: LAPAROSCOPIC SALPINGO OOPHORECTOMY;  Surgeon: Gae Dry, MD;  Location: [facility] ORS;  Service: Gynecology;  Laterality: Left;   LAPAROSCOPIC UNILATERAL SALPINGECTOMY Right 08/11/2015   Procedure: LAPAROSCOPIC UNILATERAL SALPINGECTOMY;  Surgeon: Gae Dry, MD;  Location: [facility] ORS;  Service: Gynecology;  Laterality: Right;   LAPAROSCOPY N/A 04/05/2015   Procedure: LAPAROSCOPY OPERATIVE;  Surgeon: Gae Dry, MD;  Location: [facility] ORS;  Service: Gynecology;  Laterality: N/A;    Family History  Problem Relation Age of Onset   Ovarian cancer Mother 85       NEG for [place]   Breast cancer Mother 29       49 dx 2nd time pt is NEG for BRACA   Breast cancer Maternal Grandmother 74  52 dx twice    CVA Maternal Grandmother    Dementia Maternal Grandmother    Diabetes Maternal Grandmother    Hypertension Maternal Grandmother    Hyperlipidemia Father    Hypertension Father    Breast cancer Maternal Aunt 27       BRCA NEG; SECOND TIME AGE 72   Dementia Maternal Grandfather    Diabetes Maternal Grandfather    Hypertension Maternal Grandfather    Hypertension Paternal Grandfather    Heart attack Paternal Grandfather    Cancer Paternal Grandfather    Breast cancer Cousin        BRCA NEG.     Social History   Socioeconomic History   Marital status: Single    Spouse name: Not on file   Number of children: Not on file   Years of education: Not on file   Highest education level: Not on  file  Occupational History   Not on file  Tobacco Use   Smoking status: Never   Smokeless tobacco: Never  Vaping Use   Vaping Use: Never used  Substance and Sexual Activity   Alcohol use: Yes    Comment: OCC   Drug use: No   Sexual activity: Yes    Birth control/protection: Surgical  Other Topics Concern   Not on file  Social History Narrative   Not on file   Social Determinants of Health   Financial Resource Strain: Not on file  Food Insecurity: Not on file  Transportation Needs: Not on file  Physical Activity: Not on file  Stress: Not on file  Social Connections: Not on file  Intimate Partner Violence: Not on file    Outpatient Medications Prior to Visit  Medication Sig Dispense Refill   acetaminophen (TYLENOL) 500 MG tablet Take 2 tablets (1,000 mg total) by mouth every 6 (six) hours as needed. 30 tablet 0   ALPRAZolam (XANAX) 0.5 MG tablet Take 0.25-0.5 mg by mouth See admin instructions. Take 0.19m in the morning and 0.5 in the evening as needed for anxiety.  0   buPROPion (WELLBUTRIN XL) 150 MG 24 hr tablet Take 450 mg by mouth daily.     buPROPion (WELLBUTRIN XL) 300 MG 24 hr tablet Take 300 mg by mouth daily.     DULoxetine (CYMBALTA) 30 MG capsule Take 90 mg by mouth every morning.   1   metoprolol (LOPRESSOR) 50 MG tablet Take 100 mg by mouth 2 (two) times daily.      albuterol (VENTOLIN HFA) 108 (90 Base) MCG/ACT inhaler Inhale 2 puffs into the lungs every 4 (four) hours as needed for wheezing.     Cholecalciferol 125 MCG (5000 UT) capsule Take 5,000 Units by mouth daily.     ibuprofen (ADVIL,MOTRIN) 200 MG tablet Take 800 mg by mouth every 6 (six) hours as needed for moderate pain.     lidocaine (LIDODERM) 5 % Place 1 patch onto the skin daily. Remove & Discard patch within 12 hours or as directed by MD 7 patch 0   meclizine (ANTIVERT) 25 MG tablet Take 25 mg by mouth 3 (three) times daily as needed for dizziness. (Patient not taking: Reported on 12/08/2020)  0    methocarbamol (ROBAXIN) 500 MG tablet Take 2 tablets (1,000 mg total) by mouth every 6 (six) hours as needed for muscle spasms. (Patient not taking: Reported on 12/08/2020) 20 tablet 0   methylphenidate (RITALIN) 10 MG tablet Take 10 mg by mouth 2 (two) times daily as needed. For concentration. (  Patient not taking: Reported on 12/08/2020)     mirabegron ER (MYRBETRIQ) 25 MG TB24 tablet Take 1 tablet (25 mg total) by mouth daily. (Patient not taking: Reported on 12/08/2020) 90 tablet 3   ondansetron (ZOFRAN-ODT) 8 MG disintegrating tablet Take 8 mg by mouth every 8 (eight) hours as needed for nausea.  (Patient not taking: Reported on 12/08/2020)  0   pantoprazole (PROTONIX) 40 MG tablet Take 40 mg by mouth 2 (two) times daily.  (Patient not taking: Reported on 12/08/2020)     SYMBICORT 80-4.5 MCG/ACT inhaler Inhale 2 puffs into the lungs 2 (two) times daily. (Patient not taking: Reported on 12/08/2020)     tetrahydrozoline-zinc (VISINE-AC) 0.05-0.25 % ophthalmic solution Apply 2 drops to eye 2 (two) times daily as needed (Allergies and burning eyes). (Patient not taking: Reported on 12/08/2020)     triamcinolone cream (KENALOG) 0.1 % Apply 1 application topically daily as needed (rash).  (Patient not taking: Reported on 12/08/2020)     Vitamin D, Ergocalciferol, (DRISDOL) 1.25 MG (50000 UT) CAPS capsule Take 1 capsule (50,000 Units total) by mouth every 7 (seven) days. For 3 months. (Patient not taking: Reported on 12/08/2020) 4 capsule 2   No facility-administered medications prior to visit.      ROS:  Review of Systems  Constitutional:  Negative for fever.  Gastrointestinal:  Negative for blood in stool, constipation, diarrhea, nausea and vomiting.  Genitourinary:  Negative for dyspareunia, dysuria, flank pain, frequency, hematuria, urgency, vaginal bleeding, vaginal discharge and vaginal pain.  Musculoskeletal:  Negative for back pain.  Skin:  Negative for rash.  BREAST: No  symptoms   OBJECTIVE:   Vitals:  BP 112/70   Ht _0  (1.575 m)   Wt 212 lb (96.2 kg)   LMP 01/18/2017 (Approximate)   BMI 38.78 kg/m   Physical Exam Vitals reviewed.  Constitutional:      Appearance: She is well-developed.  Pulmonary:     Effort: Pulmonary effort is normal.  Genitourinary:    General: Normal vulva.     Pubic Area: No rash.      Labia:        Right: No rash, tenderness or lesion.        Left: No rash, tenderness or lesion.      Vagina: Normal. No vaginal discharge, erythema or tenderness.     Cervix: Normal.     Uterus: Normal. Not enlarged and not tender.      Adnexa: Right adnexa normal and left adnexa normal.       Right: No mass or tenderness.         Left: No mass or tenderness.    Musculoskeletal:        General: Normal range of motion.     Cervical back: Normal range of motion.  Skin:    General: Skin is warm and dry.  Neurological:     General: No focal deficit present.     Mental Status: She is alert and oriented to person, place, and time.  Psychiatric:        Mood and Affect: Mood normal.        Behavior: Behavior normal.        Thought Content: Thought content normal.        Judgment: Judgment normal.    Assessment/Plan: Screening for STD (sexually transmitted disease) - Plan: HIV Antibody (routine testing w rflx), RPR, HSV 2 antibody, IgG, Hepatitis C antibody, Cervicovaginal ancillary only; Will f/u with results.  Exposure to STD - Plan: HIV Antibody (routine testing w rflx), RPR, HSV 2 antibody, IgG, Hepatitis C antibody, Cervicovaginal ancillary only    Return if symptoms worsen or fail to improve.  Hien Cunliffe B. Adda Stokes, PA-C 12/08/2020 9:30 AM

## 2020-12-09 LAB — CERVICOVAGINAL ANCILLARY ONLY
Chlamydia: NEGATIVE
Comment: NEGATIVE
Comment: NEGATIVE
Comment: NORMAL
Neisseria Gonorrhea: NEGATIVE
Trichomonas: NEGATIVE

## 2020-12-09 LAB — HEPATITIS C ANTIBODY: Hep C Virus Ab: <0.1 s/co ratio (ref 0.0–0.9)

## 2020-12-09 LAB — HSV 2 ANTIBODY, IGG: HSV 2 IgG, Type Spec: <0.91 index (ref 0.00–0.90)

## 2020-12-09 LAB — HIV ANTIBODY (ROUTINE TESTING W REFLEX): HIV Screen 4th Generation wRfx: NONREACTIVE

## 2020-12-09 LAB — RPR: RPR Ser Ql: NONREACTIVE

## 2021-12-20 ENCOUNTER — Other Ambulatory Visit: Payer: Self-pay | Admitting: Obstetrics & Gynecology

## 2021-12-20 ENCOUNTER — Other Ambulatory Visit: Payer: Self-pay | Admitting: Obstetrics and Gynecology

## 2021-12-20 ENCOUNTER — Other Ambulatory Visit: Payer: Self-pay | Admitting: Family Medicine

## 2021-12-20 DIAGNOSIS — Z1231 Encounter for screening mammogram for malignant neoplasm of breast: Secondary | ICD-10-CM

## 2022-01-11 ENCOUNTER — Ambulatory Visit
Admission: RE | Admit: 2022-01-11 | Discharge: 2022-01-11 | Disposition: A | Discharge status: Home / Self Care | Payer: BC Managed Care – PPO | Source: Ambulatory Visit | Attending: Obstetrics and Gynecology | Admitting: Obstetrics and Gynecology

## 2022-01-11 DIAGNOSIS — Z1231 Encounter for screening mammogram for malignant neoplasm of breast: Secondary | ICD-10-CM | POA: Diagnosis present

## 2022-01-15 ENCOUNTER — Other Ambulatory Visit: Payer: Self-pay | Admitting: Obstetrics and Gynecology

## 2022-01-15 DIAGNOSIS — N6489 Other specified disorders of breast: Secondary | ICD-10-CM

## 2022-01-15 DIAGNOSIS — R928 Other abnormal and inconclusive findings on diagnostic imaging of breast: Secondary | ICD-10-CM

## 2022-01-30 ENCOUNTER — Ambulatory Visit
Admission: RE | Admit: 2022-01-30 | Discharge: 2022-01-30 | Disposition: A | Discharge status: Home / Self Care | Payer: BC Managed Care – PPO | Source: Ambulatory Visit | Attending: Obstetrics and Gynecology | Admitting: Obstetrics and Gynecology

## 2022-01-30 DIAGNOSIS — N6489 Other specified disorders of breast: Secondary | ICD-10-CM | POA: Insufficient documentation

## 2022-01-30 DIAGNOSIS — R928 Other abnormal and inconclusive findings on diagnostic imaging of breast: Secondary | ICD-10-CM | POA: Diagnosis not present

## 2022-02-01 ENCOUNTER — Encounter: Payer: Self-pay | Admitting: Obstetrics and Gynecology

## 2022-03-27 ENCOUNTER — Other Ambulatory Visit: Payer: Self-pay | Admitting: Student

## 2022-03-27 DIAGNOSIS — H538 Other visual disturbances: Secondary | ICD-10-CM

## 2022-03-27 DIAGNOSIS — F40298 Other specified phobia: Secondary | ICD-10-CM

## 2022-03-27 DIAGNOSIS — R202 Paresthesia of skin: Secondary | ICD-10-CM

## 2022-03-27 DIAGNOSIS — R519 Headache, unspecified: Secondary | ICD-10-CM

## 2022-04-25 ENCOUNTER — Other Ambulatory Visit: Payer: BC Managed Care – PPO

## 2022-08-03 ENCOUNTER — Inpatient Hospital Stay: Admission: RE | Admit: 2022-08-03 | Payer: BC Managed Care – PPO | Source: Ambulatory Visit

## 2023-04-24 ENCOUNTER — Other Ambulatory Visit: Payer: Self-pay | Admitting: Nurse Practitioner

## 2023-04-24 DIAGNOSIS — N6489 Other specified disorders of breast: Secondary | ICD-10-CM

## 2023-04-24 DIAGNOSIS — Z1231 Encounter for screening mammogram for malignant neoplasm of breast: Secondary | ICD-10-CM

## 2023-05-06 ENCOUNTER — Other Ambulatory Visit: Payer: Self-pay

## 2023-05-06 ENCOUNTER — Inpatient Hospital Stay: Admission: RE | Admit: 2023-05-06 | Payer: Self-pay | Source: Ambulatory Visit

## 2023-05-22 ENCOUNTER — Other Ambulatory Visit: Payer: Self-pay | Admitting: Nurse Practitioner

## 2023-05-22 DIAGNOSIS — R1011 Right upper quadrant pain: Secondary | ICD-10-CM

## 2023-05-22 DIAGNOSIS — R109 Unspecified abdominal pain: Secondary | ICD-10-CM

## 2023-05-28 ENCOUNTER — Ambulatory Visit
Admission: RE | Admit: 2023-05-28 | Discharge: 2023-05-28 | Disposition: A | Discharge status: Home / Self Care | Payer: BC Managed Care – PPO | Source: Ambulatory Visit | Attending: Nurse Practitioner | Admitting: Nurse Practitioner

## 2023-05-28 DIAGNOSIS — N6489 Other specified disorders of breast: Secondary | ICD-10-CM | POA: Insufficient documentation

## 2023-05-28 DIAGNOSIS — R1011 Right upper quadrant pain: Secondary | ICD-10-CM | POA: Insufficient documentation

## 2023-05-28 DIAGNOSIS — Z1231 Encounter for screening mammogram for malignant neoplasm of breast: Secondary | ICD-10-CM | POA: Insufficient documentation

## 2023-05-28 DIAGNOSIS — R109 Unspecified abdominal pain: Secondary | ICD-10-CM | POA: Insufficient documentation

## 2023-05-29 ENCOUNTER — Other Ambulatory Visit: Payer: Self-pay | Admitting: Nurse Practitioner

## 2023-05-29 ENCOUNTER — Ambulatory Visit
Admission: RE | Admit: 2023-05-29 | Discharge: 2023-05-29 | Discharge status: Home / Self Care | Payer: BC Managed Care – PPO | Source: Ambulatory Visit | Attending: Nurse Practitioner | Admitting: Nurse Practitioner

## 2023-05-29 DIAGNOSIS — R109 Unspecified abdominal pain: Secondary | ICD-10-CM

## 2023-05-29 DIAGNOSIS — R1011 Right upper quadrant pain: Secondary | ICD-10-CM

## 2023-12-30 ENCOUNTER — Encounter (HOSPITAL_COMMUNITY): Payer: Self-pay | Admitting: Nurse Practitioner
